# Patient Record
Sex: Male | Born: 1960 | Race: Black or African American | Hispanic: No | Marital: Married | Smoking: Former smoker
Health system: Southern US, Community
[De-identification: ages and names within clinical notes are randomized; demographics above are authoritative.]

## PROBLEM LIST (undated history)

## (undated) DIAGNOSIS — F99 Mental disorder, not otherwise specified: Secondary | ICD-10-CM

## (undated) HISTORY — PX: OTHER SURGICAL HISTORY: SHX169

---

## 2000-04-29 HISTORY — PX: OTHER SURGICAL HISTORY: SHX169

## 2000-11-06 ENCOUNTER — Encounter (HOSPITAL_COMMUNITY): Admission: RE | Admit: 2000-11-06 | Discharge: 2000-12-06 | Payer: Self-pay | Admitting: Preventative Medicine

## 2000-11-25 ENCOUNTER — Encounter: Payer: Self-pay | Admitting: Preventative Medicine

## 2000-11-25 ENCOUNTER — Ambulatory Visit (HOSPITAL_COMMUNITY): Admission: RE | Admit: 2000-11-25 | Discharge: 2000-11-25 | Payer: Self-pay | Admitting: Preventative Medicine

## 2000-12-21 ENCOUNTER — Emergency Department (HOSPITAL_COMMUNITY): Admission: EM | Admit: 2000-12-21 | Discharge: 2000-12-21 | Payer: Self-pay | Admitting: Emergency Medicine

## 2000-12-21 ENCOUNTER — Encounter: Payer: Self-pay | Admitting: Emergency Medicine

## 2001-02-25 ENCOUNTER — Encounter: Payer: Self-pay | Admitting: Orthopedic Surgery

## 2001-02-25 ENCOUNTER — Ambulatory Visit (HOSPITAL_COMMUNITY): Admission: RE | Admit: 2001-02-25 | Discharge: 2001-02-25 | Payer: Self-pay | Admitting: Orthopedic Surgery

## 2001-02-26 ENCOUNTER — Observation Stay (HOSPITAL_COMMUNITY): Admission: RE | Admit: 2001-02-26 | Discharge: 2001-02-27 | Payer: Self-pay | Admitting: Orthopedic Surgery

## 2001-12-20 ENCOUNTER — Emergency Department (HOSPITAL_COMMUNITY): Admission: EM | Admit: 2001-12-20 | Discharge: 2001-12-20 | Payer: Self-pay | Admitting: *Deleted

## 2001-12-20 ENCOUNTER — Encounter: Payer: Self-pay | Admitting: *Deleted

## 2002-02-10 ENCOUNTER — Ambulatory Visit (HOSPITAL_COMMUNITY): Admission: RE | Admit: 2002-02-10 | Discharge: 2002-02-10 | Payer: Self-pay | Admitting: Family Medicine

## 2002-02-10 ENCOUNTER — Encounter: Payer: Self-pay | Admitting: Family Medicine

## 2003-10-13 ENCOUNTER — Ambulatory Visit (HOSPITAL_COMMUNITY): Admission: RE | Admit: 2003-10-13 | Discharge: 2003-10-13 | Payer: Self-pay | Admitting: Orthopedic Surgery

## 2004-03-29 ENCOUNTER — Emergency Department (HOSPITAL_COMMUNITY): Admission: EM | Admit: 2004-03-29 | Discharge: 2004-03-29 | Payer: Self-pay | Admitting: Emergency Medicine

## 2004-05-15 ENCOUNTER — Ambulatory Visit (HOSPITAL_BASED_OUTPATIENT_CLINIC_OR_DEPARTMENT_OTHER): Admission: RE | Admit: 2004-05-15 | Discharge: 2004-05-15 | Payer: Self-pay | Admitting: Orthopedic Surgery

## 2008-08-03 ENCOUNTER — Emergency Department (HOSPITAL_COMMUNITY): Admission: EM | Admit: 2008-08-03 | Discharge: 2008-08-04 | Payer: Self-pay | Admitting: Emergency Medicine

## 2008-08-04 ENCOUNTER — Inpatient Hospital Stay (HOSPITAL_COMMUNITY): Admission: AD | Admit: 2008-08-04 | Discharge: 2008-08-09 | Payer: Self-pay | Admitting: Psychiatry

## 2008-08-04 ENCOUNTER — Ambulatory Visit: Payer: Self-pay | Admitting: Psychiatry

## 2010-05-20 ENCOUNTER — Encounter: Payer: Self-pay | Admitting: Family Medicine

## 2010-08-08 LAB — URINALYSIS, ROUTINE W REFLEX MICROSCOPIC
Hgb urine dipstick: NEGATIVE
Nitrite: NEGATIVE
Protein, ur: 30 mg/dL — AB
Urobilinogen, UA: 1 mg/dL (ref 0.0–1.0)

## 2010-08-08 LAB — CBC
MCHC: 34.5 g/dL (ref 30.0–36.0)
MCV: 85.5 fL (ref 78.0–100.0)
Platelets: 323 10*3/uL (ref 150–400)
RDW: 13.8 % (ref 11.5–15.5)

## 2010-08-08 LAB — DIFFERENTIAL
Basophils Absolute: 0 10*3/uL (ref 0.0–0.1)
Basophils Relative: 1 % (ref 0–1)
Eosinophils Absolute: 0 10*3/uL (ref 0.0–0.7)
Monocytes Relative: 11 % (ref 3–12)
Neutrophils Relative %: 63 % (ref 43–77)

## 2010-08-08 LAB — BASIC METABOLIC PANEL
BUN: 30 mg/dL — ABNORMAL HIGH (ref 6–23)
CO2: 26 mEq/L (ref 19–32)
Chloride: 92 mEq/L — ABNORMAL LOW (ref 96–112)
Creatinine, Ser: 1.31 mg/dL (ref 0.4–1.5)
Glucose, Bld: 116 mg/dL — ABNORMAL HIGH (ref 70–99)

## 2010-08-08 LAB — RAPID URINE DRUG SCREEN, HOSP PERFORMED
Amphetamines: NOT DETECTED
Opiates: NOT DETECTED
Tetrahydrocannabinol: NOT DETECTED

## 2010-08-08 LAB — URINE MICROSCOPIC-ADD ON

## 2010-09-11 NOTE — H&P (Signed)
NAME:  Marc Summers, Marc Summers NO.:  [id number]   MEDICAL RECORD NO.:  [id number]          PATIENT TYPE:  IPS   LOCATION:  0402                          FACILITY:  [facility]   PHYSICIAN:  Jasmine Pang, M.D. DATE OF BIRTH:  05-27-1960   DATE OF ADMISSION:  08/04/2008  DATE OF DISCHARGE:                       PSYCHIATRIC ADMISSION ASSESSMENT   A 50 year old male involuntarily petitioned on August 04, 2008.   HISTORY OF PRESENT ILLNESS:  The patient is here on petition.  He was  petitioned per his twin brother.  Petition papers state the patient has  not been eating, not communicating and isolating.  The patient does  state that he has not been talking to his brother.  Tells him that if he  wanted to talk to him, he will.  He does state that he has lost some  weight recently because he has no money yet.  He talks about going to  the bank and wanting to withdraw a fair amount of money so that he not  does have to keep walking back and forth to the bank.  He denies any  depression, suicidality.  Denies any hallucinations.  Denies any  substance use and has not had any past or current psychiatric treatment.   PAST PSYCHIATRIC HISTORY:  First admission to the [facility].  No other petitions.  No other psychiatric admissions.   SOCIAL HISTORY:  The patient lives alone.  He has 2 children ages 30 and  71 that he sees occasionally.  He is unemployed.  Denies any  disability.   FAMILY HISTORY:  None.   ALCOHOL/DRUG HISTORY:  He is a nonsmoker.  Denies any alcohol or drug  use.   PRIMARY CARE [place]:  Dr. Gerda Diss.   MEDICAL PROBLEMS:  He denies any acute or chronic health issues.   MEDICATIONS:  None prior to arrival.   DRUG ALLERGIES:  NO KNOWN ALLERGIES.   PHYSICAL EXAMINATION:  GENERAL:  This is a thin male.  Physical exam in  the emergency department states that the patient was uncooperative.  He  is today calm and cooperative, although somewhat  irritable.  VITAL SIGNS:  Temperature 98.4, 82 heart rate, 18 respirations, blood  pressure is 100/69.  He is 6 feet 1 inches tall, 159 pounds.   LABORATORY DATA:  CBC within normal limits.  Alcohol level less than 5.  Glucose of 116.  Urine drug screen is negative.  Urinalysis shows 15  ketones.   MENTAL STATUS EXAM:  He is again fully alert, dressed in scrubs,  cooperative, good eye contact.  Again somewhat irritable, somewhat  guarded.  Speech is clear.  He appears flat.  Thought processes are  coherent.  There is no evidence of any delusional statements.  He denies  any suicide or homicidal thoughts.  Cognitive function intact.  His  memory appears intact.  Judgment and insight is fair.   AXIS I:  Mood disorder.  AXIS II:  Deferred.  AXIS III:  No known medical conditions.  AXIS IV:  Deferred at this time.  AXIS V:  Current is 35.  PLAN:  Our plan is to monitor his food and fluid intake.  We will have a  family session with brother as soon as possible.  We offered medications  for sleep.  The patient declines at this time.  States he is just  feeling fine.  Staff is helpful.  Encouraged group activities.  Tentative length of stay at this time is 3-4 days.      Landry Corporal, N.P.      Jasmine Pang, M.D.  Electronically Signed    JO/MEDQ  D:  08/05/2008  T:  08/05/2008  Job:  [id number]
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
## 2010-09-14 NOTE — Op Note (Signed)
NAME:  Marc Summers, Marc Summers NO.:  [id number]   MEDICAL RECORD NO.:  [id number]          PATIENT TYPE:  AMB   LOCATION:  DSC                          FACILITY:  [facility]   PHYSICIAN:  Leonides Grills, M.D.     DATE OF BIRTH:  [birth year]   DATE OF PROCEDURE:  05/15/2004  DATE OF DISCHARGE:                                 OPERATIVE REPORT   PREOPERATIVE DIAGNOSES:  1.  Right varus ankle.  2.  Right hindfoot malposition.  3.  Loose bodies, right ankle.  4.  Right tibial spurs.  5.  Right peroneal tendon subluxation.  6.  Right peroneus brevis tendon tear.  7.  Right medial talar dome osteochondral lesion.  8.  Right deltoid ligament contracture.   POSTOPERATIVE DIAGNOSES:  1.  Right varus ankle.  2.  Right hindfoot malposition.  3.  Loose bodies, right ankle.  4.  Right tibial spurs.  5.  Right peroneal tendon subluxation.  6.  Right peroneus brevis tendon tear.  7.  Right medial talar dome osteochondral lesion.  8.  Right deltoid ligament contracture.   OPERATION:  1.  Right distal tibiofibular osteotomy, i.e. Weber-type.  2.  Open reduction, internal fixation, right distal tibiofibular osteotomy.  3.  Right lateralizing calcaneal osteotomy.  4.  Stress x-rays, right ankle.  5.  Repair right subluxing peroneal tendons.  6.  Excision right peroneus brevis tendon tear.  7.  Peroneus brevis to peroneus longus tendon transfer.  8.  Right ankle arthroscopy with extensive debridement.  9.  Debridement and drilling medial talar done osteochondral lesion, ankle      impingement.  10. Excision tibial spurs.  11. Removal of loose bodies, ankle.  12. Release deep deltoid ligament.   ANESTHESIA:  General endotracheal tube with popliteal block.   SURGEON:  Leonides Grills, M.D.   ASSISTANT:  Lianne Cure, P.A.-C.   ESTIMATED BLOOD LOSS:  Minimal.   TOURNIQUET TIME:  2 hours.   COMPLICATIONS:  None.   DISPOSITION:  Stable to PAR.   INDICATION:  This is a  50 year old male, who has had longstanding ankle pain  that was interfering with his life to the point he could not do what he  wants to do.  He is found to have a varus tilt to his ankle with a varus  alignment to his plafond and hindfoot varus with posterolateral ankle pain  and a loose body that was present on the anterior aspect of his ankle with  tibial spurs as well and a medial talar dome osteochondral lesion from the  abnormal eccentric wear on the talar dome.  He was consented for the above  procedure.  All risks which include infection, neurovascular injury,  nonunion, malunion, hardware rotation, hardware failure, persistent pain,  worsening pain, stiffness, arthritis, and possible ankle fusion, prolonged  recovery were all explained.  Questions were encouraged and answered.   DESCRIPTION OF OPERATION:  The patient was brought to the operating room and  placed in supine position.  After adequate general endotracheal tube  anesthesia was administered as well as Ancef 1 g IV piggyback,  a bump was  then placed under the right ipsilateral hip using a bean-bag.  All bony  prominences are well-padded, and the right lower extremity was then prepped  and draped in a sterile manner over a proximally-placed thigh tourniquet.  The limb was gravity-exsanguinated, and tourniquet was elevated to 290 mmHg.  A curvilinear incision was then made midline over the lateral malleolus,  extending beyond this about 2-3 cm.  Dissection was carried down through  skin, hemostasis obtained.  We progressive anterior to this and developed a  soft tissue plane just in front of not only the fibula but also the tibia.  Soft tissues were elevated over the tibia.  X-rays were then evaluated for  the soft tissue dissection which was directly over where the osteotomy would  be made.  No soft tissue was violated anywhere beyond this point medially  and laterally.  Bennetts were then placed.  And under C-arm  guidance, prior  to [facility] placement, two 2.0 mm K-wires were placed for the osteotomy  closing wedge cut, as preoperatively planned.  Once this was done, we then  used a sagittal saw with Bennetts in place and took out a wedge of bone,  cutting not only the fibula but the tibia on the inner aspects of both K-  wires, taking care to protect the soft tissues both anteriorly and  posteriorly.  Once this was done, the wedge was removed.  We then applied a  6-hole one-third tubular plate.  We placed initially a 3.5 fully-threaded  cortical set screw through the plate and then placed a push-pull screw  proximal to this.  We then placed a Verbrugge clamp and reduced and closed  the osteotomy down, keeping the medial cortex of the tibia intact and using  this as a hinge.  This had an excellent apposition of the osteotomy and  maintenance of the correction.  We then placed another 3.5 mm fully-threaded  cortical screw in a compressed position.  Because the fact that there was a  tremendous amount of strain on the hardware and there was bending of the  hardware, we then placed two 4.5 mm fully-threaded cortical four cortices-  type syndesmotic screws, and this had excellent maintenance of the  correction and as well as compression across the osteotomy site.  We then  replaced the 3.5 mm fully-threaded cortical screws with 4.5 mm fully-  threaded cortical screws.  This had excellent purchase and maintenance in  the correction and with  solid fixation.  We then obtained stress x-rays in  the AP and lateral planes, not only showed that the correction was  excellent, fixation was in proper position, but also that there was no gross  motion across the arthrodesis site, and the tibia did not open up medially.  We then copiously irrigated this area with normal saline.  We then dissected  posterior to this with a separate approach to the peroneal tendons.  We then made a longitudinal incision in the  retinacular sheath 2 mm just posterior  to the edge of the lateral malleolus.  We purposely put the hardware  anterior on the fibula to allow [place] room for repair of the peroneal  retinaculum for the subluxing peroneal tendons.  There was a tremendous  amount of synovitis and scar tissue within the peroneal tendons.  The  peroneus  brevis tendon was completely shredded for about 3-4 cm in length.  The longest was intact.  We excised the peroneus brevis tendon.  We then  transferred the peroneus brevis to peroneus longus tendons proximally using  #2 FiberWire.  We did not perform a tenodesis of the distal aspect of the  distal aspect of the peroneus brevis tendon due to the fact that there was  no excursion of this tendon distally, and it was scarred throughout.  This  was released and debrided as distal as possible.  We then copiously  irrigated with normal saline.  There were no palpable spurs in this area.  We then created a bed in the posterolateral corner of the lateral malleolus,  used a curved quarter-inch osteotome and a rongeur, and then multiple drills  were placed for suture repair.  We then placed #2 FiberWire through the  retinaculum and repaired it back down into this groove, respectively.  This  had excellent and outstanding solid repair.  The remaining portion of the  retinaculum was repaired with 2-0 FiberWire.  The area was copiously  irrigated with normal saline.  We then made a longitudinal incision over the  lateral wall of the calcaneus.  The dissection was carried down to bone.  Soft tissue was elevated both superiorly and inferiorly.  Osteotomy was then  made in the calcaneal tuber, perpendicular to the lateral wall of the  calcaneus.  We then freed up the soft tissues with a large osteotome and a  lamina spreader and then translated the calcaneus approximately 5-7 mm  laterally and provisionally fixed this with a 2-0 K-wire.  We then made a  longitudinal incision over  the midline aspect of the heel posteriorly at the  __________  skin border.  Two 55 mm long 16 mm threaded 6.5 mm cancellous  Synthes screws were then placed using 4.5, 3.2 mm drill holes, respectively.  This had excellent maintenance of the correction and purchase as well.  We  then obtained x-rays in the lateral and axial views of the hindfoot and  showed that the fixation was in proper position with an excellent correction  as well.  We then copiously irrigated the wounds with normal saline.  Tourniquet was deflated at 2 hours.  Hemostasis was obtained.  Then we  repaired the subcu with 3-0 Vicryl.  Skin was closed with 4-0 nylon.  We  then placed a spinal needle in the anteromedial aspect of the ankle just  medial to the anterior tibialis tendon.  There was a tremendous amount of  scar tissue in this area.  We then made a nick and spread technique to  create the anteromedial portal.  There was a large amount of synovitis and scar tissue over the entire anterior aspect of the ankle as well as the  loose body that was blocking the entrance as well.  We then, just lateral to  the peroneus tertius tendon, made a nick and spread technique in the  anterolateral portal.  We then placed the bevel radiofrequency wand into the  ankle and started to debride the synovitis, doing an extensive debridement  of the entire ankle.  This took quite some time.  Once this was completely  debrided of synovitis, we were able to visualize the loose bodies.  We then  required more extensile approach to this and did an actual ankle arthrotomy  to remove the loose bodies, for they were quite large.  Once the loose  bodies were removed with the synovectomy rongeur with the scope as an aid  through an ankle arthrotomy, we then were able to visualize the entire  ankle.  There was a large osteochondral lesion involving about 25% of the  talar dome surface medially.  We then used a microfracture technique to  place  multiple drill holes in the osteochondral lesion once it was  adequately debrided.  We then performed a deep deltoid ligament release  using a freer in the medial gutter of the ankle.  This had an excellent  release.  We then performed stress x-rays at the end and showed that the  ankle was adequately reduced.  There was no talar tilt.  I was very happy  with the repair.  Multiple pictures were obtained throughout the arthroscopy  as well.  We then debrided the talar spurs using a bur as well as a  synovectomy rongeur over the entire anterior aspect of the tibia.  This had  an excellent repair.  I was very satisfied with the overall repair.  All the  wounds were copiously irrigated with normal saline.  Skin was closed with 4-  0 nylon.  This entire portion of the procedure was done without tourniquet.  Sterile dressing was applied.  Jones dressing was applied.  The patient was  stable throughout.      Paul   PB/MEDQ  D:  05/15/2004  T:  05/15/2004  Job:  [id number]

## 2010-09-14 NOTE — Op Note (Signed)
[facility]  Patient:    XAIVER, ROSKELLEY I Visit Number: [id number] MRN: [id number]          Service Type: SUR Location: 4W 0450 01 Attending Physician:  Verlee Rossetti. Dictated by:   Almedia Balls Ranell Patrick, M.D. Proc. Date: 02/26/01 Admit Date:  02/26/2001 Discharge Date: 02/27/2001   CC:         Sherri Rad, M.D.   Operative Report  PREOPERATIVE DIAGNOSIS:  Right ankle anterior impingement with talar neck osteophyte, distal tibial osteophyte, and distal tibial chondral defect.  POSTOPERATIVE DIAGNOSIS:  Right ankle anterior impingement with talar neck osteophyte, distal tibial osteophyte, distal tibial chondral defect, and talar chondral defect.  ATTENDING SURGEON:  Almedia Balls. Ranell Patrick, M.D.  Mammie LorenzoMarland Kitchen  Sherri Rad, M.D.  ANESTHESIA:  Laryngeal mask.  ESTIMATED BLOOD LOSS:  Minimal.  TOURNIQUET TIME:  53 minutes.  FLUID REPLACEMENT:  1200 cc of crystalloid.  INSTRUMENT COUNT:  Correct.  COMPLICATIONS:  None.  ANTIBIOTICS:  No antibiotics were given.  INDICATIONS:  The patient is a 50 year old gentleman who presents complaining of chronic right ankle pain.  He is very active in basketball and states that he has difficulty dorsiflexing his foot.  Because of pain with impact activities located in the anterolateral aspect of the joint in addition he has had a couple of instability episodes where he has rolled his ankle.  The latest of these was back in June of 2002.  Pain is worse problem and he appears to have instability secondary to pain, which is preventing him from doing activities of daily living, as well as sports.  X-rays demonstrate large osteophytes on the neck of the talus, as well as the distal tibia, causing impingement.  In addition, there was noted what appears to be an osteochondral lesion on the distal aspect of his tibia.  After discussing with the patient the options of management, including continued physical  therapy, activity modifications versus arthroscopic evaluation of his osteochondral lesion, as well as open debridement of his bone spurs, he has elected to proceed with surgery.  DESCRIPTION OF PROCEDURE:  After an adequate level of laryngeal mask anesthesia was achieved and 1 g of Ancef was given preoperatively, the patient was positioned supine on the operating table and examination under anesthesia was performed to both ankles.  This demonstrated competency of the calcaneal, tibial, and anterior talar fibular ligaments on the right side.  His examination in the office had been limited secondary to pain.  In addition, significant heel varus was noted of his hind foot.  As well, dorsiflexion was limited to 0 where he could dorsiflex 20 degrees on his other ankle and he could plantar flex down to 60 degrees.  After completion of the EUA, the right lower extremity was prepped and draped in a sterile fashion.  A nonsterile tourniquet had been placed in the right proximal thigh.  Standard arthroscopic portals were created in the anteromedial and anterolateral gutters.  These were done using infiltration of 0.5% Marcaine with epinephrine followed by incision with an 11 blade scalpel through the skin, spreading using a hemostat down to the capsule and insertion of the scope cannula in the joint using a blunt obturator.  Diagnostic arthroscopy revealed significant chondral damage to the distal tibia, which was full thickness.  The loose flaps of cartilage were debrided using a full radius resector.  In addition, scar tissue was removed from medial and latter gutters with some hypertrophic synovium removed from the anterior and lateral  aspect of the joint.  Anterior tibial osteophytes were noted.  As well, there was noted to be a 1.5 x 2 cm area of eburnated bone on the talar dome in the anteromedial location.  This appeared to correspond with the foot dorsiflexed to impingement by an anterior  medial osteophyte.  The joint was thoroughly irrigated and arthroscopic debridement was concluded at this time.  The arthroscopic portals were extended distally for approximately 1.5 cm to allow open exposure of the anterior aspect of the tibiotalar joint.  An osteotome was then used to remove the overhanging spur back to the normal cartilage on the talar neck.  Again, the osteotome was used to remove the talar spur.  Direct visualization was achieved all the way across the anterior aspect of the joint.  The joint was thoroughly irrigated. At this time, the wound was thoroughly irrigated and then the wounds were closed using 2-0 Vicryl followed by a 4-0 nylon mattress suture.  A sterile dressing was applied followed by a short leg splint.  The patient was awaken and taken to the PACU in stable condition. Dictated by:   Almedia Balls Ranell Patrick, M.D. Attending Physician:  Malon Kindle R. DD:  02/26/01 TD:  03/01/01 Job: [id number]

## 2011-02-19 ENCOUNTER — Emergency Department (HOSPITAL_COMMUNITY)
Admission: EM | Admit: 2011-02-19 | Discharge: 2011-02-19 | Disposition: A | Discharge status: Other Acute Inpatient Hospital | Payer: Self-pay | Attending: Emergency Medicine | Admitting: Emergency Medicine

## 2011-02-19 ENCOUNTER — Emergency Department (HOSPITAL_COMMUNITY): Payer: Self-pay

## 2011-02-19 ENCOUNTER — Encounter: Payer: Self-pay | Admitting: *Deleted

## 2011-02-19 DIAGNOSIS — E876 Hypokalemia: Secondary | ICD-10-CM | POA: Insufficient documentation

## 2011-02-19 DIAGNOSIS — R45850 Homicidal ideations: Secondary | ICD-10-CM | POA: Insufficient documentation

## 2011-02-19 LAB — CBC
MCH: 28.4 pg (ref 26.0–34.0)
MCV: 83.7 fL (ref 78.0–100.0)
Platelets: 274 10*3/uL (ref 150–400)
RDW: 13.3 % (ref 11.5–15.5)
WBC: 7.5 10*3/uL (ref 4.0–10.5)

## 2011-02-19 LAB — DIFFERENTIAL
Basophils Absolute: 0 10*3/uL (ref 0.0–0.1)
Eosinophils Absolute: 0.2 10*3/uL (ref 0.0–0.7)
Eosinophils Relative: 3 % (ref 0–5)
Lymphocytes Relative: 41 % (ref 12–46)

## 2011-02-19 LAB — BASIC METABOLIC PANEL
Calcium: 9.4 mg/dL (ref 8.4–10.5)
GFR calc Af Amer: >90 mL/min (ref >90)
GFR calc non Af Amer: >90 mL/min (ref >90)
Sodium: 140 mEq/L (ref 135–145)

## 2011-02-19 LAB — URINALYSIS, ROUTINE W REFLEX MICROSCOPIC
Ketones, ur: NEGATIVE mg/dL
Leukocytes, UA: NEGATIVE
Nitrite: NEGATIVE
Protein, ur: NEGATIVE mg/dL

## 2011-02-19 LAB — RAPID URINE DRUG SCREEN, HOSP PERFORMED: Amphetamines: NOT DETECTED

## 2011-02-19 LAB — ETHANOL: Alcohol, Ethyl (B): <11 mg/dL (ref 0–11)

## 2011-02-19 MED ORDER — POTASSIUM CHLORIDE CRYS ER 20 MEQ PO TBCR
40.0000 meq | EXTENDED_RELEASE_TABLET | Freq: Once | ORAL | Status: DC
  Filled 2011-02-19: qty 2

## 2011-02-19 MED ORDER — ZIPRASIDONE MESYLATE 20 MG IM SOLR
20.0000 mg | Freq: Once | INTRAMUSCULAR | Status: AC
  Administered 2011-02-19: 20 mg via INTRAMUSCULAR

## 2011-02-19 MED ORDER — ZIPRASIDONE MESYLATE 20 MG IM SOLR
INTRAMUSCULAR | Status: AC
  Administered 2011-02-19: 20 mg via INTRAMUSCULAR
  Filled 2011-02-19: qty 20
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
## 2011-02-19 NOTE — ED Notes (Signed)
Ate 100% of lunch

## 2011-02-19 NOTE — ED Notes (Signed)
Pt encouraged to provide urine specimen; pt reports will provide as soon as possible; cooperative, pleasant; in no distress; offered meal tray, drink.
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
## 2011-02-19 NOTE — ED Notes (Signed)
Pt is sitting in chair watching TV. [employer] still; at bedside. Pt still in handcuffs. Vital signs WNL. NAD noted at this time.

## 2011-02-22 ENCOUNTER — Encounter (HOSPITAL_COMMUNITY): Payer: Self-pay | Admitting: *Deleted

## 2012-04-07 ENCOUNTER — Encounter (HOSPITAL_COMMUNITY): Payer: Self-pay | Admitting: *Deleted

## 2012-04-07 ENCOUNTER — Emergency Department (HOSPITAL_COMMUNITY): Payer: Self-pay

## 2012-04-07 ENCOUNTER — Emergency Department (HOSPITAL_COMMUNITY)
Admission: EM | Admit: 2012-04-07 | Discharge: 2012-04-10 | Disposition: A | Discharge status: Other Acute Inpatient Hospital | Payer: Self-pay | Attending: Emergency Medicine | Admitting: Emergency Medicine

## 2012-04-07 DIAGNOSIS — Z87891 Personal history of nicotine dependence: Secondary | ICD-10-CM | POA: Insufficient documentation

## 2012-04-07 DIAGNOSIS — F29 Unspecified psychosis not due to a substance or known physiological condition: Secondary | ICD-10-CM | POA: Insufficient documentation

## 2012-04-07 LAB — URINALYSIS, ROUTINE W REFLEX MICROSCOPIC
Leukocytes, UA: NEGATIVE
Nitrite: NEGATIVE
Specific Gravity, Urine: >1.03 — ABNORMAL HIGH (ref 1.005–1.030)
Urobilinogen, UA: 2 mg/dL — ABNORMAL HIGH (ref 0.0–1.0)
pH: 5.5 (ref 5.0–8.0)

## 2012-04-07 LAB — COMPREHENSIVE METABOLIC PANEL
ALT: 22 U/L (ref 0–53)
Alkaline Phosphatase: 52 U/L (ref 39–117)
CO2: 24 mEq/L (ref 19–32)
Calcium: 10.2 mg/dL (ref 8.4–10.5)
Chloride: 87 mEq/L — ABNORMAL LOW (ref 96–112)
GFR calc Af Amer: >90 mL/min (ref >90)
GFR calc non Af Amer: >90 mL/min (ref >90)
Glucose, Bld: 111 mg/dL — ABNORMAL HIGH (ref 70–99)
Sodium: 134 mEq/L — ABNORMAL LOW (ref 135–145)
Total Bilirubin: 1.3 mg/dL — ABNORMAL HIGH (ref 0.3–1.2)

## 2012-04-07 LAB — SALICYLATE LEVEL: Salicylate Lvl: <2 mg/dL — ABNORMAL LOW (ref 2.8–20.0)

## 2012-04-07 LAB — CBC WITH DIFFERENTIAL/PLATELET
Eosinophils Relative: 0 % (ref 0–5)
HCT: 46.4 % (ref 39.0–52.0)
Lymphocytes Relative: 22 % (ref 12–46)
Lymphs Abs: 2.2 10*3/uL (ref 0.7–4.0)
MCV: 82.3 fL (ref 78.0–100.0)
Neutro Abs: 6.9 10*3/uL (ref 1.7–7.7)
Platelets: 291 10*3/uL (ref 150–400)
RBC: 5.64 MIL/uL (ref 4.22–5.81)
WBC: 10.2 10*3/uL (ref 4.0–10.5)

## 2012-04-07 LAB — URINE MICROSCOPIC-ADD ON

## 2012-04-07 LAB — RAPID URINE DRUG SCREEN, HOSP PERFORMED
Barbiturates: NOT DETECTED
Cocaine: NOT DETECTED
Tetrahydrocannabinol: NOT DETECTED

## 2012-04-07 LAB — ACETAMINOPHEN LEVEL: Acetaminophen (Tylenol), Serum: <15 ug/mL (ref 10–30)

## 2012-04-07 MED ORDER — ONDANSETRON HCL 4 MG PO TABS: 4.0000 mg | ORAL_TABLET | Freq: Three times a day (TID) | ORAL | Status: DC | PRN

## 2012-04-07 MED ORDER — NICOTINE 21 MG/24HR TD PT24: 21.0000 mg | MEDICATED_PATCH | Freq: Every day | TRANSDERMAL | Status: DC

## 2012-04-07 MED ORDER — ZIPRASIDONE MESYLATE 20 MG IM SOLR: 20.0000 mg | Freq: Two times a day (BID) | INTRAMUSCULAR | Status: DC | PRN

## 2012-04-07 MED ORDER — IBUPROFEN 400 MG PO TABS: 600.0000 mg | ORAL_TABLET | Freq: Three times a day (TID) | ORAL | Status: DC | PRN

## 2012-04-07 NOTE — ED Provider Notes (Signed)
History   This chart was scribed for Glynn Octave, MD by Sofie Rower, ED Scribe. The patient was seen in room APA16A/APA16A and the patient's care was started at 4:08PM.     CSN: [id number]  Arrival date & time 04/07/12  1508   First MD Initiated Contact with Patient 04/07/12 1608      Chief Complaint  Patient presents with  . V70.1    (Consider location/radiation/quality/duration/timing/severity/associated sxs/prior treatment) The history is provided by the patient and the police. No language interpreter was used.    Marc Summers is a 51 y.o. male , who presents to the Emergency Department complaining of  gradual, progressively worsening, altered mental status, onset today (04/07/12). The pt was found within his home by his brother earlier this afternoon, whom became concerned after discovering that the pt had been wrapped within a sleeping bag, locked in a closet, trying to say warm. The pt informs he has been drinking plenty of water, however, he has been living in his home without running water or electricity over the past two years. Additionally, the pt informs he has not experienced a bowel movement within the last 53 days. Furthermore, the pt denies hematochezia, using any drugs, and experiencing hallucinations at present.     History reviewed. No pertinent past medical history.  History reviewed. No pertinent past surgical history.  No family history on file.  History  Substance Use Topics  . Smoking status: Not on file  . Smokeless tobacco: Not on file  . Alcohol Use: No      Review of Systems  10 Systems reviewed and all are negative for acute change except as noted in the HPI.    Allergies  Review of patient's allergies indicates no known allergies.  Home Medications  No current outpatient prescriptions on file.  BP 120/82  Pulse 112  Temp 97.5 F (36.4 C) (Oral)  Resp 20  Ht 5\' 11"  (1.803 m)  Wt 161 lb (73.029 kg)  BMI 22.45 kg/m2  SpO2  100%  Physical Exam  Nursing note and vitals reviewed. Constitutional: He is oriented to person, place, and time. He appears well-developed and well-nourished. No distress.       Pt presents malnourished and foul smelling.   HENT:  Head: Atraumatic.  Nose: Nose normal.  Mouth/Throat: Mucous membranes are dry.  Eyes: EOM are normal.  Neck: Normal range of motion.  Cardiovascular: Normal rate, regular rhythm and normal heart sounds.   Pulmonary/Chest: Effort normal and breath sounds normal. No respiratory distress.  Abdominal: Soft. Bowel sounds are normal. There is no tenderness.  Musculoskeletal: Normal range of motion.  Neurological: He is alert and oriented to person, place, and time. No cranial nerve deficit.       Cranial nerves intact. 5/5 strength throughout. No clonus detected.   Skin: Skin is warm and dry. He is not diaphoretic.  Psychiatric: He has a normal mood and affect. His behavior is normal.    ED Course  Procedures (including critical care time)  DIAGNOSTIC STUDIES: Oxygen Saturation is 100% on room air, normal by my interpretation.    COORDINATION OF CARE:   4:30 PM- Treatment plan discussed with patient. Pt agrees with treatment.  5:35PM- Phone consultation with ACT team. Tele-pysch evaluation reccommended.     Results for orders placed during the hospital encounter of 04/07/12  CBC WITH DIFFERENTIAL      Component Value Range   WBC 10.2  4.0 - 10.5 K/uL   RBC 5.64  4.22 - 5.81 MIL/uL   Hemoglobin 16.7  13.0 - 17.0 g/dL   HCT 16.1  09.6 - 04.5 %   MCV 82.3  78.0 - 100.0 fL   MCH 29.6  26.0 - 34.0 pg   MCHC 36.0  30.0 - 36.0 g/dL   RDW 40.9  81.1 - 91.4 %   Platelets 291  150 - 400 K/uL   Neutrophils Relative 67  43 - 77 %   Neutro Abs 6.9  1.7 - 7.7 K/uL   Lymphocytes Relative 22  12 - 46 %   Lymphs Abs 2.2  0.7 - 4.0 K/uL   Monocytes Relative 10  3 - 12 %   Monocytes Absolute 1.1 (*) 0.1 - 1.0 K/uL   Eosinophils Relative 0  0 - 5 %    Eosinophils Absolute 0.0  0.0 - 0.7 K/uL   Basophils Relative 0  0 - 1 %   Basophils Absolute 0.0  0.0 - 0.1 K/uL  COMPREHENSIVE METABOLIC PANEL      Component Value Range   Sodium 134 (*) 135 - 145 mEq/L   Potassium 4.1  3.5 - 5.1 mEq/L   Chloride 87 (*) 96 - 112 mEq/L   CO2 24  19 - 32 mEq/L   Glucose, Bld 111 (*) 70 - 99 mg/dL   BUN 26 (*) 6 - 23 mg/dL   Creatinine, Ser 7.82  0.50 - 1.35 mg/dL   Calcium 95.6  8.4 - 21.3 mg/dL   Total Protein 8.9 (*) 6.0 - 8.3 g/dL   Albumin 4.4  3.5 - 5.2 g/dL   AST 19  0 - 37 U/L   ALT 22  0 - 53 U/L   Alkaline Phosphatase 52  39 - 117 U/L   Total Bilirubin 1.3 (*) 0.3 - 1.2 mg/dL   GFR calc non Af Amer >90  >90 mL/min   GFR calc Af Amer >90  >90 mL/min  ETHANOL      Component Value Range   Alcohol, Ethyl (B) <11  0 - 11 mg/dL  URINE RAPID DRUG SCREEN (HOSP PERFORMED)      Component Value Range   Opiates NONE DETECTED  NONE DETECTED   Cocaine NONE DETECTED  NONE DETECTED   Benzodiazepines NONE DETECTED  NONE DETECTED   Amphetamines NONE DETECTED  NONE DETECTED   Tetrahydrocannabinol NONE DETECTED  NONE DETECTED   Barbiturates NONE DETECTED  NONE DETECTED  URINALYSIS, ROUTINE W REFLEX MICROSCOPIC      Component Value Range   Color, Urine AMBER (*) YELLOW   APPearance CLEAR  CLEAR   Specific Gravity, Urine >1.030 (*) 1.005 - 1.030   pH 5.5  5.0 - 8.0   Glucose, UA NEGATIVE  NEGATIVE mg/dL   Hgb urine dipstick SMALL (*) NEGATIVE   Bilirubin Urine MODERATE (*) NEGATIVE   Ketones, ur 40 (*) NEGATIVE mg/dL   Protein, ur TRACE (*) NEGATIVE mg/dL   Urobilinogen, UA 2.0 (*) 0.0 - 1.0 mg/dL   Nitrite NEGATIVE  NEGATIVE   Leukocytes, UA NEGATIVE  NEGATIVE  ACETAMINOPHEN LEVEL      Component Value Range   Acetaminophen (Tylenol), Serum <15.0  10 - 30 ug/mL  SALICYLATE LEVEL      Component Value Range   Salicylate Lvl <2.0 (*) 2.8 - 20.0 mg/dL  CK      Component Value Range   Total CK 52  7 - 232 U/L  URINE MICROSCOPIC-ADD ON       Component Value Range  Squamous Epithelial / LPF RARE  RARE   WBC, UA 0-2  <3 WBC/hpf   RBC / HPF 3-6  <3 RBC/hpf   Bacteria, UA FEW (*) RARE   Urine-Other MUCOUS PRESENT    TROPONIN I      Component Value Range   Troponin I <0.30  <0.30 ng/mL   Dg Abd Acute W/chest  04/07/2012  *RADIOLOGY REPORT*  Clinical Data: Medical clearance.  Abdominal pain.  ACUTE ABDOMEN SERIES (ABDOMEN 2 VIEW & CHEST 1 VIEW)  Comparison: None.  Findings: No infiltrate, congestive heart failure or pneumothorax. Heart size within normal limits.  No plain film evidence of bowel obstruction or free intraperitoneal air.  IMPRESSION: No acute abnormality.  Please see above   Original Report Authenticated By: Lacy Duverney, M.D.         Dg Abd Acute W/chest  04/07/2012  *RADIOLOGY REPORT*  Clinical Data: Medical clearance.  Abdominal pain.  ACUTE ABDOMEN SERIES (ABDOMEN 2 VIEW & CHEST 1 VIEW)  Comparison: None.  Findings: No infiltrate, congestive heart failure or pneumothorax. Heart size within normal limits.  No plain film evidence of bowel obstruction or free intraperitoneal air.  IMPRESSION: No acute abnormality.  Please see above   Original Report Authenticated By: Lacy Duverney, M.D.      1. Psychosis       MDM  Patient IVC by police after being found not taking care of himself.  Has been holed up in house for weeks without electricity or water.  Patient is paranoid, thinking his food is being poisoned. He is trying to "cleanse himself through God." Labs unremarkable. No obstruction on AAS. Patient refuses rectal exam to assess for impaction.  Psychiatry consult complete. Patient is delusional and out of touch with reality,hypervigilant. He is psychotic and benefits from psych admission. Geodon 20 mg IM every 12 hours as needed recommended.   Date: 04/07/2012  Rate: 88  Rhythm: normal sinus rhythm  QRS Axis: normal  Intervals: normal  ST/T Wave abnormalities: nonspecific ST/T changes  Conduction  Disutrbances:none  Narrative Interpretation:   Old EKG Reviewed: none available      I personally performed the services described in this documentation, which was scribed in my presence. The recorded information has been reviewed and is accurate.    Glynn Octave, MD 04/07/12 [phone]

## 2012-04-07 NOTE — ED Notes (Signed)
Per IVC papers, pt has been "distraught" over losing his mother and a disability case with his employment since 2009.  The papers state that pt has not been taking care of himself.  Pt reports to me that he has not eaten in 53 days.  States it's something that he and God are handling.  States, "I'm trying to cleanse".  When asked from what, states "there are a lot of steroids and other toxins that they are injecting in food".  Per IVC papers, pt was found at home with no running water, no heat, huddled in a closet.

## 2012-04-07 NOTE — ED Notes (Signed)
Telepsych completed.  

## 2012-04-08 NOTE — ED Provider Notes (Signed)
ACT [facility] has evaluated.  Pending placement at [facility] 400 hall bed.  [facility] states bed likely not going to be ready until tomorrow.    Laray Anger, DO 04/08/12 [phone]

## 2012-04-08 NOTE — Progress Notes (Signed)
Informed by Marc Summers, the ac on duty, that pt has been accepted by [facility] to [facility] pending a 400 hall bed.
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
## 2012-04-08 NOTE — ED Notes (Signed)
Family came to visit pt.  Pt remained calm.  nad noted

## 2012-04-08 NOTE — BHH Counselor (Signed)
Received a call from Glenetta Borg, supervisor at DSS. She was calling in regard to Mr Gumz. She explained that DSS was aware of Mr Belsito and that DSS is unable to do anything about the situation. Mr Ogawa has refused services, They contacted [facility] but could not get an out patient commitment, nor could they get a referral for intense out patient services, because the patient had to agree. Mr Boettner continues to refuse follow up and medications after discharge. Mr Schwandt may well benefit from an outpatient commitment to allow for services after discharge.

## 2012-04-08 NOTE — ED Notes (Signed)
MD at bedside. 

## 2012-04-08 NOTE — ED Notes (Signed)
[facility] called and reports that pt has been placed on the waiting list.

## 2012-04-09 ENCOUNTER — Encounter (HOSPITAL_COMMUNITY): Payer: Self-pay

## 2012-04-09 NOTE — ED Notes (Signed)
Pt ate 100% breakfast tray and requested peanut butter crackers and water.

## 2012-04-09 NOTE — ED Notes (Signed)
Patient is turn on side and appears to be resting.  Even rise and fall of chest.  No complaints expressed at this time and no needs verbalized.  Sitter continues to observe at bedside.

## 2012-04-09 NOTE — ED Notes (Signed)
Room assignment now available when this RN called to ask if patient had been assigned a room.  Report given at that time to [place], RN    Assigned to room 401 bed 02

## 2012-04-09 NOTE — Progress Notes (Signed)
04/09/12 11:56 AM Talked with pt who continues to be delusional. Continues to say that he had not eaten or had a BM in 53 days prior to coming to the hospital. He said that his family just did not understand that he was drinking water to clean his system out and that God was helping him. His plan was to loose weight and then start eating again. He does state that he is eating now and had a BM yesterday. Does not want to take any medications: He leaves that up to God.

## 2012-04-10 ENCOUNTER — Encounter (HOSPITAL_COMMUNITY): Payer: Self-pay

## 2012-04-10 ENCOUNTER — Inpatient Hospital Stay (HOSPITAL_COMMUNITY)
Admission: EM | Admit: 2012-04-10 | Discharge: 2012-04-16 | DRG: 885 | Disposition: A | Discharge status: Home / Self Care | Payer: Federal, State, Local not specified - Other | Source: Ambulatory Visit | Attending: Psychiatry | Admitting: Psychiatry

## 2012-04-10 DIAGNOSIS — F39 Unspecified mood [affective] disorder: Secondary | ICD-10-CM | POA: Diagnosis present

## 2012-04-10 DIAGNOSIS — F1210 Cannabis abuse, uncomplicated: Secondary | ICD-10-CM | POA: Diagnosis present

## 2012-04-10 DIAGNOSIS — F22 Delusional disorders: Principal | ICD-10-CM | POA: Diagnosis present

## 2012-04-10 HISTORY — DX: Mental disorder, not otherwise specified: F99

## 2012-04-10 MED ORDER — HALOPERIDOL 2 MG PO TABS
2.0000 mg | ORAL_TABLET | Freq: Two times a day (BID) | ORAL | Status: DC
  Filled 2012-04-10 (×16): qty 1

## 2012-04-10 MED ORDER — ADULT MULTIVITAMIN W/MINERALS CH
1.0000 | ORAL_TABLET | Freq: Every day | ORAL | Status: DC
  Filled 2012-04-10 (×9): qty 1

## 2012-04-10 MED ORDER — BENZTROPINE MESYLATE 0.5 MG PO TABS
0.5000 mg | ORAL_TABLET | Freq: Two times a day (BID) | ORAL | Status: DC
  Filled 2012-04-10 (×16): qty 1

## 2012-04-10 MED ORDER — TRAZODONE HCL 50 MG PO TABS: 50.0000 mg | ORAL_TABLET | Freq: Every evening | ORAL | Status: DC | PRN

## 2012-04-10 NOTE — Tx Team (Signed)
Initial Interdisciplinary Treatment Plan  PATIENT STRENGTHS: (choose at least two) Average or above average intelligence Communication skills Religious Affiliation  PATIENT STRESSORS: Financial difficulties Health problems   PROBLEM LIST: Problem List/Patient Goals Date to be addressed Date deferred Reason deferred Estimated date of resolution  psychosis 04/10/2012                                                      DISCHARGE CRITERIA:  Ability to meet basic life and health needs Improved stabilization in mood, thinking, and/or behavior Medical problems require only outpatient monitoring Verbal commitment to aftercare and medication compliance  PRELIMINARY DISCHARGE PLAN: Attend aftercare/continuing care group Attend PHP/IOP Return to previous living arrangement  PATIENT/FAMIILY INVOLVEMENT: This treatment plan has been presented to and reviewed with the patient, Marc Summers, and/or family member,.  The patient and family have been given the opportunity to ask questions and make suggestions.  JEHU-APPIAH, Mirko Tailor K 04/10/2012, 5:16 AM
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
## 2012-04-10 NOTE — Treatment Plan (Signed)
Interdisciplinary Treatment Plan Update (Adult)  Date: 04/10/2012  Time Reviewed: 11:35 AM   Progress in Treatment: Attending groups: No Participating in groups: No Taking medication as prescribed: Yes Tolerating medication: Yes   Family/Significant other contact made:  No Patient understands diagnosis:  No  Extremely paranoid with no insight Discussing patient identified problems/goals with staff:  Yes  See below Medical problems stabilized or resolved:  Yes Denies suicidal/homicidal ideation: Yes Issues/concerns per patient self-inventory:  Not filled out Other:  New problem(s) identified: N/A  Reason for Continuation of Hospitalization: Hallucinations Medication stabilization Other; describe Paranoia  Interventions implemented related to continuation of hospitalization: Anti psychotic medication trial  Encourage group attendance and participation  Additional comments:  Estimated length of stay: 4-5 days  Discharge Plan: unknown  New goal(s): N/A  Review of initial/current patient goals per problem list:   1.  Goal(s): Eliminate psychosis  Met:  No  Target date:12/18  As evidenced [id number] will appear less paranoid and will report no AH  2.  Goal (s): Identify comprehensive mental wellness plan  Met:  No  Target date:12/18  As evidenced by: Self report in discussion with social worker  3.  Goal(s): Identify dispositional plan  Met:  No  Target date:12/18  As evidenced [id number] report  4.  Goal(s):  Met:  No  Target date:  As evidenced by:  Attendees: Patient:     Family:     Physician:  Thedore Mins 04/10/2012 11:35 AM   Nursing:  Nestor Ramp  04/10/2012 11:35 AM   Clinical Social Worker:  Richelle Ito 04/10/2012 11:35 AM   Extender:  Verne Spurr PA 04/10/2012 11:35 AM   Other:     Other:     Other:     Other:      Scribe for Treatment Team:   Ida Rogue, 04/10/2012 11:35 AM
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
## 2012-04-10 NOTE — ED Notes (Signed)
Left with [facility] Deputy to transport to [facility]. Clothing sent with patient/deputy

## 2012-04-10 NOTE — Progress Notes (Signed)
Nutrition Brief Note  Patient identified on the Malnutrition Screening Tool (MST) Report  Body mass index is 21.30 kg/(m^2). Pt meets criteria for WNL based on current BMI.   Currently without diet order; pt was admitted this morning to [facility]. Labs and medications reviewed.   Chart reviewed, note pt's report of not eating for several day for "cleansing" and decreased ability for self care at home.  Wt hx currently unknown.  Significant wt change between 12/10 and 12/13.  Will need to re-weigh.   Per chart review, available wt's include 159 lbs in 07/2008, indicating the wt of 161 lbs may be closer to a usual wt for pt. Will need to assess pt's intake prior to initiating any supplements.  If wt change and pt's report of not eating for 53 days is correct, he is at-risk for refeeding syndrome.  Supplements will not be ordered at this time to modulate intake.  Will order an MVI for pt for supplemental K, Mg, and Phos.  Notified staff of nutrition concerns. If nutrition issues arise, please consult RD.   Loyce Dys, MS RD LDN Clinical Inpatient Dietitian Pager: [phone] Weekend/After hours pager: [phone]

## 2012-04-10 NOTE — Progress Notes (Signed)
[facility] LCSW Aftercare Discharge Planning Group Note  04/10/2012 11:45 AM  Participation Quality:  did not attend    Marc Summers 04/10/2012, 11:45 AM

## 2012-04-10 NOTE — Progress Notes (Signed)
Patient ID: Marc Summers, male   DOB: [birth year], 51 y.o.   MRN: [id number] D: Patient in dayroom on approach. Pt presented with depressed mood. Pt is still religiously preoccupied stating he "doesn't need medications that God will cure him". Pt is cooperative and stated he is willing to make the best out of the situation. Pt denies SI/HI/AVH and pain. Pt stated he had diarrha this afternoon but had subsided. Pt attended evening wrap up group and Interacted appropriately with peers. Pt stated he had a "great day".  No acute distressed noted at this time. Pt encouraged to come to staff with any question or concerns   A: Met with pt 1:1. Safety has been maintained with Q15 minutes observation. Supported and encouragement provided to attend groups.   R: Patient remains safe. He is complaint with  group programming. Safety has been maintained Q15 and continue current POC

## 2012-04-10 NOTE — Progress Notes (Signed)
[facility] Group Notes:  (Counselor/Nursing/MHT/Case Management/Adjunct)  04/10/2012 11:09 PM  Type of Therapy:  Psychoeducational Skills  Participation Level:  Minimal  Participation Quality:  Redirectable  Affect:  Blunted  Cognitive:  Lacking  Insight:  Lacking  Engagement in Group:  Resistant  Engagement in Therapy:  Resistant  Modes of Intervention:  Education  Summary of Progress/Problems:The patient described his day as being "great".  He would not elaborate any further or offer any details. He states that he couldn't state a goal at this time,ie. "its hard to set a goal".    Caidence Kaseman S 04/10/2012, 11:09 PM

## 2012-04-10 NOTE — ED Notes (Signed)
Awaiting arrival of Law Enforcement to transport patient to [facility] per IVC papers

## 2012-04-10 NOTE — Progress Notes (Signed)
Patient ID: Marc Summers, male   DOB: December 18, 1960, 51 y.o.   MRN: [id number] 04-10-12 nursing shift note: D: pt is paranoid and hyper religious. He is a fall risk and uses a wheelchair. When asked why he was here, he blamed his family for putting him in the hospital. A: when asked by rn  why is here , he stated he had been non compliant with his home medication and had been fasting (not eating) for 53 days "he said", leading him to an involuntary admission.  He is eating on the unit. He has been in this facility before.  R: he denied any si/hi/av.  will monitor and q 15 min cks continue.

## 2012-04-11 DIAGNOSIS — F39 Unspecified mood [affective] disorder: Secondary | ICD-10-CM

## 2012-04-11 DIAGNOSIS — F22 Delusional disorders: Principal | ICD-10-CM

## 2012-04-11 NOTE — Progress Notes (Signed)
Psychoeducational Group Note  Date:  04/11/2012 Time:  2000  Group Topic/Focus:  Wrap-Up Group:   The focus of this group is to help patients review their daily goal of treatment and discuss progress on daily workbooks.  Participation Level:  Active  Participation Quality:  Appropriate  Affect:  Appropriate  Cognitive:  Appropriate  Insight:  Limited  Engagement in Group:  Engaged  Additional Comments:  Patient attended and participated in group tonight. He reports having a great day. He observed and enjoyed intelligent people and got a chance to attend groups  Marc Summers 04/11/2012, 9:47 PM

## 2012-04-11 NOTE — Progress Notes (Signed)
Patient ID: Marc Summers, male   DOB: May 26, 1960, 51 y.o.   MRN: [id number] 04-11-12 nursing shift note: d: he has taken his medications today, had no complaints and came to groups. A: when participating in the group he had to be redirected by the rn to stay on the subject. He was easily redirectable and cooperative. R: rn will continue to support, praise and acknowledge the pt. rn will monitor and q 15 min cks continue.

## 2012-04-11 NOTE — Progress Notes (Signed)
Psychoeducational Group Note  Date:  04/11/2012 Time:0915am  Group Topic/Focus:  Identifying Needs:   The focus of this group is to help patients identify their personal needs that have been historically problematic and identify healthy behaviors to address their needs.  Participation Level:  Minimal  Participation Quality:  Resistant  Affect:  Appropriate  Cognitive:  Appropriate  Insight:  Poor  Engagement in Group:  Poor  Additional Comments:  Inventory group   Valente David 04/11/2012,10:03 AM
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
## 2012-04-11 NOTE — Progress Notes (Signed)
Patient ID: Marc Summers, male   DOB: Nov 11, 1960, 51 y.o.   MRN: [id number]   S:   Seen today. Stays in his room most of the time. Reports good sleep and good mood. Thinks he is doing well. Not sure why he is here.  MSE:   General Appearance: Bizarre and Disheveled  Eye Contact:: Fair  Speech: Clear and Coherent  Volume: Increased  Mood: Angry and Irritable  Affect: Congruent  Thought Process: Disorganized  Orientation: Full (Time, Place, and Person)  Thought Content: Delusions and Paranoid Ideation  Suicidal Thoughts: No  Homicidal Thoughts: No Memory: Immediate; Fair  Recent; Fair  Remote; Fair  Judgement: Poor  Insight: Lacking  Psychomotor Activity: Increased  Concentration: Poor  Recall: Fair  Akathisia: No  Handed: Right  AIMS (if indicated):  Assets: Communication Skills  Sleep: Number of Hours: 0     Assessment:  AXIS I: Mood Disorder NOS. Delusional disorder, NOS  AXIS II: Deferred  AXIS III:  Past Medical History   S/p right ankle ORIF    Diagnosis  Date   AXIS IV: economic problems, occupational problems and other psychosocial or environmental problems  AXIS V: 21-30 behavior considerably influenced by delusions or hallucinations OR serious impairment in judgment, communication OR inability to function in almost all areas  Treatment Plan/Recommendations:  Continue current meds

## 2012-04-12 NOTE — Progress Notes (Addendum)
Patient ID: Marc Summers, male   DOB: 1961-04-04, 51 y.o.   MRN: [id number] 04-12-12 @ 1600 D: pt is still very resistant to care. He is not taking his medications and is staying in his room a great deal of the time. A staff continue to encourage and support him. R: he still will not take any medications. He does deny any si/hi/av. rn will monitor and q 15 min cks continue.

## 2012-04-12 NOTE — Progress Notes (Signed)
Psychoeducational Group Note  Date:  04/12/2012 Time:  2000  Group Topic/Focus:  Wrap-Up Group:   The focus of this group is to help patients review their daily goal of treatment and discuss progress on daily workbooks.  Participation Level:  Active  Participation Quality:  Appropriate  Affect:  Appropriate  Cognitive:  Appropriate  Insight:  Engaged  Engagement in Group:  Engaged  Additional Comments:  Patient attended and participated in group tonight. He reports having a normal day. He went to groups, went to his meals, and socialized in the day room.  He reports that his support system was God who provides and protects  Scot Dock 04/12/2012, 9:11 PM
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
## 2012-04-12 NOTE — Progress Notes (Signed)
Pt reports having a great day. "You know it was a typical day here. It was a good day." His affect is blunted, mood remains suspicious. He reports poor sleep last night but refuses trazadone prn. Hygiene is poor. He continues to ambulate in his wheelchair. When asked about support in the community pt remains religiously preoccupied. Denies needing anyone else because "God will provide." Encouraged pt to consider trazadone prn. Also reviewed fall precautions. Assisted pt in resolving conflict with roommate over temperature in room. He states, "I do not need to be medicated. I'm just fine.' Denies SI/HI/AVH and remains safe. Lawrence Marseilles

## 2012-04-12 NOTE — Clinical Social Work Note (Signed)
[facility] Group Notes:  (Clinical Social Work)  04/12/2012   11:15-11:45AM  Summary of Progress/Problems:  The main focus of today's process group was to listen to a variety of genres of music and to identify that different types of music provoke different responses.  The patient then was able to identify personally what was soothing for them, as well as energizing.  Examples were given of how to use this knowledge in sleep habits, with depression, and with other symptoms.  The patient expressed that he believes that music does help him considerably, and he was attentive throughout, as well as suggested certain types of music to play.  Type of Therapy:  Music Therapy with processing done  Participation Level:  Active  Participation Quality:  Appropriate  Affect:  Appropriate  Cognitive:  Appropriate  Insight:  Engaged  Engagement in Therapy:  Engaged  Modes of Intervention:   Socialization, Support and Processing, Exploration, Education, Rapport Building   [employer], LCSW 04/12/2012, 12:25 PM

## 2012-04-12 NOTE — Progress Notes (Signed)
Patient ID: DASAN HARDMAN, male   DOB: 04/26/1961, 51 y.o.   MRN: [id number]   S:   Seen today. More active on the unit now and attending groups.  Reports good sleep and good mood.  Not sure why he is here. Still have delusions about suing other people.   MSE:   General Appearance: Bizarre and Disheveled  Eye Contact:: Fair  Speech: Clear and Coherent  Volume: Increased  Mood: Angry and Irritable  Affect: Congruent  Thought Process: Disorganized  Orientation: Full (Time, Place, and Person)  Thought Content: Delusions and Paranoid Ideation  Suicidal Thoughts: No  Homicidal Thoughts: No Memory: Immediate; Fair  Recent; Fair  Remote; Fair  Judgement: Poor  Insight: Lacking  Psychomotor Activity: Increased  Concentration: Poor  Recall: Fair  Akathisia: No  Handed: Right  AIMS (if indicated):  Assets: Communication Skills  Sleep: Number of Hours: 0     Assessment:  AXIS I: Mood Disorder NOS. Delusional disorder, NOS  AXIS II: Deferred  AXIS III:  Past Medical History   S/p right ankle ORIF    Diagnosis  Date   AXIS IV: economic problems, occupational problems and other psychosocial or environmental problems  AXIS V: 21-30 behavior considerably influenced by delusions or hallucinations OR serious impairment in judgment, communication OR inability to function in almost all areas  Treatment Plan/Recommendations:  Continue current meds

## 2012-04-12 NOTE — Progress Notes (Signed)
Psychoeducational Group Note  Date:  04/12/2012 Time:  0930am  Group Topic/Focus:  Making Healthy Choices:   The focus of this group is to help patients identify negative/unhealthy choices they were using prior to admission and identify positive/healthier coping strategies to replace them upon discharge.  Participation Level:  Did Not Attend  Participation Quality:    Affect:    Cognitive: Insight: Engagement in Group:  Additional Comments:  Inventory group   Valente David 04/12/2012,9:33 AM

## 2012-04-12 NOTE — Progress Notes (Signed)
Spoke with pt 1:1 who has been in the dayroom all evening. He has interacted appropriately with staff and peers. He remains religiously preoccupied. States today has been a great day and continues to be confused as to why he was admitted. Attended evening group. Supported, encouraged. Fall precautions reviewed. He denies SI/HI/AVH at present and remains safe. Lawrence Marseilles

## 2012-04-13 NOTE — Clinical Social Work Note (Signed)
  Type of Therapy: Process Group Therapy  Participation Level:  Active  Participation Quality:  Appropriate  Affect:  Appropriate  Cognitive:  Appropriate  Insight:  Limited  Engagement in Group:  Engaged  Engagement in Therapy:  Limited  Modes of Intervention:  Activity, Clarification, Education, Problem-solving and Support  Summary of Progress/Problems: Today's group addressed the issue of overcoming obstacles.  Patients were asked to identify their biggest obstacle post d/c that stands in the way of their on-going success, and then problem solve as to how to manage this.  Bradie was engaged in group.  He was in a philosophical mood.  Stated he is here for a reason even though he is not sure what that is yet.  Is clear he does not need to be here for psychiatric stabilization, "but its all good."  Talks vaguely about a plan to go to [place], then to [place], "but I have no obstacles."       Daryel Gerald B 04/13/2012   5:11 PM

## 2012-04-13 NOTE — Progress Notes (Signed)
Psychoeducational Group Note  Date:  04/13/2012 Time:  2000  Group Topic/Focus:  Wrap-Up Group:   The focus of this group is to help patients review their daily goal of treatment and discuss progress on daily workbooks.  Participation Level: Did Not Attend  Participation Quality:  Not Applicable  Affect:  Not Applicable  Cognitive:  Not Applicable  Insight:  Not Applicable  Engagement in Group: Not Applicable  Additional Comments:  Pt did not attend group.  Christ Kick 04/13/2012, 9:46 PM

## 2012-04-13 NOTE — Progress Notes (Signed)
[facility] LCSW Aftercare Discharge Planning Group Note  04/13/2012 10:19 AM  Participation Quality:  Attentive  Affect:  Appropriate  Cognitive:  Appropriate  Insight:  Limited  Engagement in Group:  Engaged  Modes of Intervention:  Discussion, Exploration and Socialization  Summary of Progress/Problems:Miki is engaging and eager to talk. States he is here IVC, and this is because he was staying with family, it was time to part ways, he went back to his place that has no heat or water, and they committed him. Admits to not eating for 60 days or so, but he was fasting as a way of purging his system and focusing on deliverance from using alcohol and drugs. Says people say he has psychosis, but he does not, and plans on taking no meds.  Is fine with returning to his home where there are no utilities because "i've been living like this for 3 years now.  It's not a problem." Daryel Gerald B 04/13/2012, 10:19 AM

## 2012-04-13 NOTE — Progress Notes (Signed)
Patient ID: Marc Summers, male   DOB: 08-01-1960, 51 y.o.   MRN: [id number] D: Pt. In bed but ad lib to BR, pt. Uses WC for support.  Pt. Reports feet swollen. Pt. Says "brother had paper work done, this the forth time"  Pt. 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 "had a loose stool on my self.  Pt. Reports he was staying with his family but they was eating stuff he didn't eat. "so I told them I'm going back home" Pt. Reports he was trying to get his body clean from all the drugs, prostitution and all kind of food." "I don't understand why that's a problem." "I was at my house, I don't have no electricity or water but I'm fine with that" "my neighbor brings me a jug of well water everyday" "I told my brother don't bring me a case just two bottles"  "when I was drugging as stuff they didn't have a problem with that, don't that seem funny." A: Writer introduced self to patient and provided emotional support by listening. Staff will monitor q85min for safety. Writer encouraged pt. To attend group. Noted non pitting edema in feet, more pronounced in right ankle. R: Writer elevate foot of the bed to help reduce swelling. Pt. Is safe on the unit. Pt. Did not attend group.

## 2012-04-13 NOTE — Progress Notes (Signed)
Pt continues to be isolative this evening. Pt been in bed resting. Noncompliant with group and recreation. Pt had his second foul smelling loose stool on himself today since admission. Pt report it's d/t him eating fruit. Pt cooperative and cleans himself up. Staff continues to monitor pt stool occurrences.

## 2012-04-13 NOTE — Progress Notes (Signed)
Pt denies SI/HI/AVH. Pt denies pain and s/s of distress. Pt noncompliant with meds. Pt reported that he do not understand why he is taking meds and that he was told that he would not be on any meds. Pt reported that he is not psychotic so why do he need meds. Pt reported that he is cleansing his body of drugs and prostitutes and that god is in control of healing. Pt reported that he is here because of his family and that he see no wrong in cleansing his body, Pt engaged on milieu. Cooperative to staff.  Medications offered to pt as ordered per MD. Verbal support given. Pt received education on meds. 15 minute checks performed for safety. Pt encouraged to attend groups.  Pt appears to have some confusion. Paranoid. Disorganized thoughts.

## 2012-04-13 NOTE — Treatment Plan (Signed)
Interdisciplinary Treatment Plan Update (Adult)  Date: 04/13/2012  Time Reviewed: 10:19 AM   Progress in Treatment: Attending groups: Yes Participating in groups: Yes Taking medication as prescribed: No refusing Tolerating medication: see above   Family/Significant other contact made:  No Patient understands diagnosis:  No  States he has no psychiatric problems Discussing patient identified problems/goals with staff:  Yes  See below Medical problems stabilized or resolved:  Yes Denies suicidal/homicidal ideation: Yes  In tx team Issues/concerns per patient self-inventory:  Not filled out Other:  New problem(s) identified: N/A  Reason for Continuation of Hospitalization: Other; describe Assess for at risk thoughts, behavior  Interventions implemented related to continuation of hospitalization: Encourage group attendance and participation,   Additional comments:  Estimated length of stay: 2-3 days  Discharge Plan: return home, follow up outpt  New goal(s): N/A  Review of initial/current patient goals per problem list:   1.  Goal(s): Eliminate psychosis  Met:  Yes  Target date:12/16  As evidenced [id number] presents with no evidence of psychosis  2.  Goal (s): Get out of here ASAP  Met:  No  Target date:12/19  As evidenced by:D/C order  3.  Goal(s):  Met:  No  Target date:  As evidenced by:  4.  Goal(s):  Met:  No  Target date:  As evidenced by:  Attendees: Patient:     Family:     Physician:  Thedore Mins 04/13/2012 10:19 AM   Nursing:    04/13/2012 10:19 AM   Clinical Social Worker:  Richelle Ito 04/13/2012 10:19 AM   Extender:   04/13/2012 10:19 AM   Other:     Other:     Other:     Other:      Scribe for Treatment Team:   Ida Rogue, 04/13/2012 10:19 AM

## 2012-04-13 NOTE — Progress Notes (Signed)
[facility] MD Progress Note  04/13/2012 2:58 PM Marc Summers  MRN:  [id number] Subjective:  Delusional Diagnosis:   Axis I: Mood Disorder NOS Axis II: Deferred Axis III:  Past Medical History  Diagnosis Date  . Mental disorder    Axis IV: economic problems, other psychosocial or environmental problems, problems related to social environment and problems with primary support group Axis V: 21-30 behavior considerably influenced by delusions or hallucinations OR serious impairment in judgment, communication OR inability to function in almost all areas  ADL's:  Intact  Sleep: Fair  Appetite:  Fair  Suicidal Ideation:  Denies  Homicidal Ideation:  Denies Psychiatric Specialty Exam: Review of Systems  Constitutional: Negative.   HENT: Negative.   Eyes: Negative.   Respiratory: Negative.   Cardiovascular: Negative.   Gastrointestinal: Negative.   Genitourinary: Negative.   Musculoskeletal: Negative.        Complaining of feet edema  Skin: Negative.   Neurological: Negative.   Endo/Heme/Allergies: Negative.   Psychiatric/Behavioral:       Delusional    Blood pressure 101/67, pulse 86, temperature 97.9 F (36.6 C), temperature source Oral, resp. rate 20, height 5\' 7"  (1.702 m), weight 61.689 kg (136 lb).Body mass index is 21.30 kg/(m^2).  General Appearance: Casual  Eye Contact::  Fair  Speech:  Normal Rate  Volume:  Increased  Mood:  Anxious  Affect:  Flat  Thought Process:  Disorganized and Loose  Orientation:  Full (Time, Place, and Person)  Thought Content:  Delusions  Suicidal Thoughts:  No  Homicidal Thoughts:  No  Memory:  Immediate;   Fair Recent;   Poor Remote;   Poor  Judgement:  Poor  Insight:  Lacking  Psychomotor Activity:  Increased  Concentration:  Fair  Recall:  Poor  Akathisia:  No  Handed:  Right  AIMS (if indicated):     Assets:  Social Support  Sleep:  Number of Hours: 3.75    Current Medications: Current Facility-Administered Medications   Medication Dose Route Frequency Provider Last Rate Last Dose  . benztropine (COGENTIN) tablet 0.5 mg  0.5 mg Oral BID Mojeed Akintayo      . haloperidol (HALDOL) tablet 2 mg  2 mg Oral BID Mojeed Akintayo      . multivitamin with minerals tablet 1 tablet  1 tablet Oral Daily Ashley Jacobs, RD      . traZODone (DESYREL) tablet 50 mg  50 mg Oral QHS PRN Mojeed Akintayo        Lab Results: No results found for this or any previous visit (from the past 48 hour(s)).  Physical Findings: AIMS:  , ,  ,  ,    CIWA:    COWS:     Treatment Plan Summary: Daily contact with patient to assess and evaluate symptoms and progress in treatment Medication management  Plan:  Review of chart, vital signs, medications, and notes. 1-Individual and group therapy continues 2-Medication management for delusions 3-Discharge plan to prevent reoccurrence 4-Supportive environment provided to optimize rehab 5-Wheelchair provided for "feet issues"  Medical Decision Making Problem Points:  Established problem, stable/improving (1) and Review of last therapy session (1) Data Points:  Review of medication regiment & side effects (2)  I certify that inpatient services furnished can reasonably be expected to improve the patient's condition.   Nanine Means, PMH-NP 04/13/2012, 2:58 PM

## 2012-04-14 NOTE — Progress Notes (Signed)
[facility] LCSW Aftercare Discharge Planning Group Note  04/14/2012 3:41 PM  Participation Quality:  Attentive  Affect:  Appropriate  Cognitive:  Appropriate  Insight:  Limited  Engagement in Group:  Engaged  Modes of Intervention:  Discussion, Exploration and Socialization  Summary of Progress/Problems:Marc Summers reports no new problems today.  He is still using a wheelchair because of edema.  He is convinced that it is due to something he is eating here.  Unconcerned.  Feels it will clear up on its own.  Ready to go whenever we are ready to release him.  Daryel Gerald B 04/14/2012, 3:41 PM

## 2012-04-14 NOTE — Progress Notes (Signed)
Seen and agreed. Berklee Battey, MD 

## 2012-04-14 NOTE — Progress Notes (Signed)
Patient ID: JAMISEN DUERSON, male   DOB: 14-Mar-1961, 51 y.o.   MRN: [id number]  D: Pt denies SI/HI/AVH. Pt is pleasant and cooperative at this time. Pt continues to be religiously pre occupied. Pt refused blood work and medications due to his beliefs. Pt states " I'm ok, my relationship with GOD will keep me going".  A: Pt was offered support and encouragement. Pt was offered scheduled medications. Pt was encourage to attend groups. Q 15 minute checks were done for safety.    R:Pt attends groups and interacts well with peers and staff. Pt is not taking medication. Pt has no complaints at this time. safety maintained on unit.

## 2012-04-14 NOTE — Progress Notes (Signed)
Patient ID: Marc Summers, male   DOB: December 12, 1960, 51 y.o.   MRN: [id number] Patient presents with depressed and suspicious mood.  He denies any SI/HI/AVH.  He remains noncompliant with his medications and minimally active in groups.  Patient states he is here against his will; he feels that he does not need to be here.  He ambulates by wheelchair due to swelling in his feet.  Patient also has poor hygiene and sometimes soils himself, however, does clean himself.    Continue to monitor medication management and MD orders.  Collaborate with treatment team members regarding patient's POC.  Safety checks completed every 15 minutes per protocol.  Patient's behavior has been appropriate; encourage and support patient.

## 2012-04-14 NOTE — Progress Notes (Signed)
[facility] MD Progress Note  04/14/2012 2:18 PM Marc Summers  MRN:  [id number] Subjective: "Doing great!" Diagnosis:  Delusional disorder NOS  ADL's:  Impaired patient remains foul smelling  Sleep: "fine"  Appetite:  Good  Suicidal Ideation:  denies Homicidal Ideation:  denies AEB (as evidenced by):Patient's verbal response to inquiry, affect and written response on daily self inventory.  Psychiatric Specialty Exam: Review of Systems  Constitutional: Negative.  Negative for fever, chills, weight loss, malaise/fatigue and diaphoresis.  HENT: Negative for congestion and sore throat.   Eyes: Negative for blurred vision, double vision and photophobia.  Respiratory: Negative for cough, shortness of breath and wheezing.   Cardiovascular: Negative for chest pain, palpitations and PND.  Gastrointestinal: Negative for heartburn, nausea, vomiting, abdominal pain, diarrhea and constipation.  Musculoskeletal: Negative for myalgias, joint pain and falls.  Neurological: Negative for dizziness, tingling, tremors, sensory change, speech change, focal weakness, seizures, loss of consciousness, weakness and headaches.  Endo/Heme/Allergies: Negative for polydipsia. Does not bruise/bleed easily.  Psychiatric/Behavioral: Negative for depression, suicidal ideas, hallucinations, memory loss and substance abuse. The patient is not nervous/anxious and does not have insomnia.     Blood pressure 113/72, pulse 92, temperature 98.9 F (37.2 C), temperature source Oral, resp. rate 16, height 5\' 7"  (1.702 m), weight 61.689 kg (136 lb).Body mass index is 21.30 kg/(m^2).  General Appearance: Disheveled  With foul smell  Eye Contact::  Good  Speech:  Clear and Coherent  Volume:  Normal  Mood:  Euthymic  Affect:  Congruent  Thought Process:  Coherent and Goal Directed  Orientation:  Full (Time, Place, and Person)  Thought Content:  WDL  Suicidal Thoughts:  No  Homicidal Thoughts:  No  Memory:  Immediate;    Fair  Judgement:  Intact  Insight:  Present  Psychomotor Activity:  Normal  Concentration:  Fair  Recall:  Fair  Akathisia:  No  Handed:  Left  AIMS (if indicated):     Assets:  Communication Skills Desire for Improvement  Sleep:  Number of Hours: 4.5    Current Medications: Current Facility-Administered Medications  Medication Dose Route Frequency Provider Last Rate Last Dose  . benztropine (COGENTIN) tablet 0.5 mg  0.5 mg Oral BID Mojeed Akintayo      . haloperidol (HALDOL) tablet 2 mg  2 mg Oral BID Mojeed Akintayo      . multivitamin with minerals tablet 1 tablet  1 tablet Oral Daily Ashley Jacobs, RD      . traZODone (DESYREL) tablet 50 mg  50 mg Oral QHS PRN Mojeed Akintayo        Lab Results: No results found for this or any previous visit (from the past 48 hour(s)).  Physical Findings: AIMS: Facial and Oral Movements Muscles of Facial Expression: None, normal Lips and Perioral Area: None, normal Jaw: None, normal Tongue: None, normal,Extremity Movements Upper (arms, wrists, hands, fingers): None, normal Lower (legs, knees, ankles, toes): None, normal, Trunk Movements Neck, shoulders, hips: None, normal, Overall Severity Severity of abnormal movements (highest score from questions above): None, normal Incapacitation due to abnormal movements: None, normal Patient's awareness of abnormal movements (rate only patient's report): No Awareness, Dental Status Current problems with teeth and/or dentures?: No Does patient usually wear dentures?: No  CIWA:    COWS:     Treatment Plan Summary: Daily contact with patient to assess and evaluate symptoms and progress in treatment Medication management  Plan: 1. This patient continues to deny an mental illness at this time.  States he is not taking any medication. 2. 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 He wants to know When he can leave, and states he is planning on returning to  his home on [address] in [place]. 3. Ordered Mg, CMP, and HgbA1c for further evaluation. 4. If all is stable and the patient continues to decline medication at this point, it is reasonable to consider discharge in 24-48 hours.  Medical Decision Making Problem Points:  Established problem, stable/improving (1) Data Points:  Review or order clinical lab tests (1)  I certify that inpatient services furnished can reasonably be expected to improve the patient's condition.  Marc Summers PAC 04/14/2012, 2:18 PM

## 2012-04-14 NOTE — Progress Notes (Signed)
Psychoeducational Group Note  Date:  04/14/2012 Time:  2000  Group Topic/Focus:  Wrap-Up Group:   The focus of this group is to help patients review their daily goal of treatment and discuss progress on daily workbooks.  Participation Level:  Minimal  Participation Quality:  Appropriate  Affect:  Appropriate  Cognitive:  Appropriate  Insight:  None  Engagement in Group:  Limited  Additional Comments:  Pt attended wrap-up group this evening. Pt didn't share much while in group. Pt stated his day being good overall. Pt plans to work towards discharge.   Tremont Gavitt A 04/14/2012, 9:49 PM

## 2012-04-14 NOTE — Progress Notes (Signed)
Psychoeducational Group Note  Date:  04/14/2012 Time:  0930  Group Topic/Focus:  Recovery Goals:   The focus of this group is to identify appropriate goals for recovery and establish a plan to achieve them.  Participation Level:  Active  Participation Quality:  Appropriate  Affect:  Appropriate  Cognitive:  Oriented  Insight:  Engaged  Engagement in Group:  Engaged  Additional Comments:  Pt was very insightful during group this morning.  Samiel Peel E 04/14/2012, 11:20 AM

## 2012-04-15 NOTE — Progress Notes (Signed)
Patient ID: Marc Summers, male   DOB: [birth year], 51 y.o.   MRN: [id number] [facility] MD Progress Note  04/15/2012 9:44 AM Marc Summers  MRN:  [id number] Subjective: "I am doing well, ready to go back home"  Diagnosis:  Delusional disorder NOS  ADL's:  fair  Sleep: better  Appetite:  Good  Suicidal Ideation:  denies Homicidal Ideation:  denies AEB (as evidenced by): Patient reports that he is doing fine, he denies delusional, psychotic or depressive symptoms. Patient is compliant with his medications and has no adverse reactions.  Psychiatric Specialty Exam: Review of Systems  Constitutional: Negative.  Negative for fever, chills, weight loss, malaise/fatigue and diaphoresis.  HENT: Negative for congestion and sore throat.   Eyes: Negative for blurred vision, double vision and photophobia.  Respiratory: Negative for cough, shortness of breath and wheezing.   Cardiovascular: Negative for chest pain, palpitations and PND.  Gastrointestinal: Negative for heartburn, nausea, vomiting, abdominal pain, diarrhea and constipation.  Musculoskeletal: Negative for myalgias, joint pain and falls.  Neurological: Negative for dizziness, tingling, tremors, sensory change, speech change, focal weakness, seizures, loss of consciousness, weakness and headaches.  Endo/Heme/Allergies: Negative for polydipsia. Does not bruise/bleed easily.  Psychiatric/Behavioral: Negative for depression, suicidal ideas, hallucinations, memory loss and substance abuse. The patient is not nervous/anxious and does not have insomnia.     Blood pressure 109/74, pulse 98, temperature 97.6 F (36.4 C), temperature source Oral, resp. rate 16, height 5\' 7"  (1.702 m), weight 61.689 kg (136 lb).Body mass index is 21.30 kg/(m^2).  General Appearance: Disheveled  With foul smell  Eye Contact::  Good  Speech:  Clear and Coherent  Volume:  Normal  Mood:  Euthymic  Affect:  Congruent  Thought Process:  Coherent and Goal  Directed  Orientation:  Full (Time, Place, and Person)  Thought Content:  WDL  Suicidal Thoughts:  No  Homicidal Thoughts:  No  Memory:  Immediate;   Fair  Judgement:  Intact  Insight:  Present  Psychomotor Activity:  Normal  Concentration:  Fair  Recall:  Fair  Akathisia:  No  Handed:  Left  AIMS (if indicated):     Assets:  Communication Skills Desire for Improvement  Sleep:  Number of Hours: 6    Current Medications: Current Facility-Administered Medications  Medication Dose Route Frequency Provider Last Rate Last Dose  . benztropine (COGENTIN) tablet 0.5 mg  0.5 mg Oral BID Trindon Dorton      . haloperidol (HALDOL) tablet 2 mg  2 mg Oral BID Verlie Liotta      . multivitamin with minerals tablet 1 tablet  1 tablet Oral Daily Ashley Jacobs, RD      . traZODone (DESYREL) tablet 50 mg  50 mg Oral QHS PRN Verlon Pischke        Lab Results: No results found for this or any previous visit (from the past 48 hour(s)).  Physical Findings: AIMS: Facial and Oral Movements Muscles of Facial Expression: None, normal Lips and Perioral Area: None, normal Jaw: None, normal Tongue: None, normal,Extremity Movements Upper (arms, wrists, hands, fingers): None, normal Lower (legs, knees, ankles, toes): None, normal, Trunk Movements Neck, shoulders, hips: None, normal, Overall Severity Severity of abnormal movements (highest score from questions above): None, normal Incapacitation due to abnormal movements: None, normal Patient's awareness of abnormal movements (rate only patient's report): No Awareness, Dental Status Current problems with teeth and/or dentures?: No Does patient usually wear dentures?: No  CIWA:    COWS:  Treatment Plan Summary: Daily contact with patient to assess and evaluate symptoms and progress in treatment Medication management  Plan: 1. Will continue current treatment. 2. Patient scheduled for discharge tomorrow  Medical Decision Making Problem  Points:  Established problem, stable/improving (1) Data Points:  Review or order clinical lab tests (1)  I certify that inpatient services furnished can reasonably be expected to improve the patient's condition.  Thedore Mins, MD 04/15/2012, 9:44 AM

## 2012-04-15 NOTE — Progress Notes (Signed)
The focus of this group is to help patients review their daily goal of treatment and discuss progress on daily workbooks. Pt attended the evening session and participated in the discussion when prompted by the Writer. He appeared frustrated at interruptions to the group by his peers, but was able to remain calm and tolerant of their behaviors. Pt reported that he had an overall good day.

## 2012-04-15 NOTE — Progress Notes (Signed)
Psychoeducational Group Note  Date:  04/15/2012 Time:  1100  Group Topic/Focus:  Healthy Communication:   The focus of this group is to discuss communication, barriers to communication, as well as healthy ways to communicate with others.  Participation Level:  Active  Participation Quality:  Appropriate  Affect:  Appropriate  Cognitive:  Appropriate  Insight:  Engaged  Engagement in Group:  Engaged  Additional Comments:  pt was appropriate in answering questions while attending the MHT group.   [name] A 04/15/2012, 1:25 PM

## 2012-04-15 NOTE — Progress Notes (Signed)
Patient ID: Marc Summers, male   DOB: Jul 10, 1960, 51 y.o.   MRN: [id number] D: Pt. Reports that he will be going home tomorrow. Pt. 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 Pt. Reports he has a lawsuit with this company and that's what they keep sending him here. Pt. Story inconsistent from story told the previous night to Clinical research associate. Pt. Says he worked for this company for 35 five years. "but God going to work it out" A: Pt. Encouraged to attend group. Staff will monitor q60min for safety. R: Pt. Is safe on the unit. Pt. attended group.

## 2012-04-15 NOTE — Progress Notes (Signed)
Pt denies SI/HI/AVH. Pt noncompliant with meds. Pt refuses to take meds, stating, "Jesus is my healer not medications". Pt continues to attend groups throughout the day. Pt is engaged on the unit.   Medications offered as ordered per MD. Verbal support given. Pt encouraged to take meds and attend groups. 15 minute checks performed for safety.  Pleasant to others and staff. Paranoid thoughts.

## 2012-04-15 NOTE — Progress Notes (Signed)
Psychoeducational Group Note  Date:  04/15/2012 Time:  1315  Group Topic/Focus:  Mental Health Association  Participation Level:  Active  Participation Quality:  Appropriate and Attentive  Affect:  Appropriate  Cognitive:  Appropriate  Insight:  Engaged  Engagement in Group:  Engaged  Additional Comments:  Pt. Had to be redirected at times for monopolizing group but pt. Was engaged and attentive to the mental health association volunteer.   Ruta Hinds [facility] 04/15/2012, 4:22 PM

## 2012-04-15 NOTE — Progress Notes (Signed)
[facility] Adult Case Management Discharge Plan :  Will you be returning to the same living situation after discharge: Yes,  home At discharge, do you have transportation home?:Yes,  sheriff Do you have the ability to pay for your medications:Yes,  no meds  Release of information consent forms completed and in the chart;  Patient's signature needed at discharge.  Patient to Follow up at: Follow-up Information    Follow up with [facility]. On 04/20/2012. (8AM)    Contact information:   [address]  [place]  [phone]         Patient denies SI/HI:   Yes,  yes    Safety Planning and Suicide Prevention discussed:  Yes,  yes  Ida Rogue 04/15/2012, 4:49 PM

## 2012-04-15 NOTE — Progress Notes (Signed)
[facility] LCSW Aftercare Discharge Planning Group Note  04/15/2012 4:43 PM  Participation Quality:  Attentive  Affect:  Appropriate  Cognitive:  Appropriate  Insight:  Limited  Engagement in Group:  Engaged  Modes of Intervention:  Discussion, Exploration and Socialization  Summary of Progress/Problems: Doreen Salvage I had contacted sheriff office about transportation tomorrow, and he was pleased with this news.  Looking forward to getting back home.  Daryel Gerald B 04/15/2012, 4:43 PM

## 2012-04-16 NOTE — [facility] Suicide Risk Assessment (Signed)
Suicide Risk Assessment  Discharge Assessment     Demographic Factors:  Male and Low socioeconomic status  Mental Status Per Nursing Assessment::   On Admission:  NA  Current Mental Status by Physician: NA  Loss Factors: Financial problems/change in socioeconomic status  Historical Factors: NA  Risk Reduction Factors:   NA  Continued Clinical Symptoms:  Resolving delusions  Cognitive Features That Contribute To Risk:  Closed-mindedness Polarized thinking    Suicide Risk:  Minimal: No identifiable suicidal ideation.  Patients presenting with no risk factors but with morbid ruminations; may be classified as minimal risk based on the severity of the depressive symptoms  Discharge Diagnoses:   AXIS I:  Delusional disorder  AXIS II:  Deferred AXIS III:   Past Medical History  Diagnosis Date   AXIS IV:  other psychosocial or environmental problems and problems related to social environment AXIS V:  61-70 mild symptoms  Plan Of Care/Follow-up recommendations:  Activity:  as tolerated Diet:  healthy Tests:  routine Other:  patient to keep his after care appointment and takes his medications as recommended.  Is patient on multiple antipsychotic therapies at discharge:  No   Has Patient had three or more failed trials of antipsychotic monotherapy by history:  No  Recommended Plan for Multiple Antipsychotic Therapies: N/A  Thedore Mins, MD 04/16/2012, 10:02 AM

## 2012-04-16 NOTE — Progress Notes (Signed)
[facility] Group Notes:  (Counselor/Nursing/MHT/Case Management/Adjunct)  04/16/2012 9:13 AM  Type of Therapy:  Discharge Planning  Participation Level:  Minimal  Participation Quality:  Attentive  Affect:  Flat  Cognitive:  Alert  Insight:  None shared  Engagement in Group:  Limited  Engagement in Therapy:  Lacking  Modes of Intervention:  Clarification and Exploration  Summary of Progress/Problems:  Leticia spent a great deal of time getting out of wheelchair and moving to regular chair thus receiving a great deal of attention. In response to identifying with SI, HI, Depression and/or Anxiety patient reports "Everything is okay" repeatedly.    Clide Dales 04/16/2012, 9:13 AM

## 2012-04-16 NOTE — Progress Notes (Addendum)
Patient ID: Marc Summers, male   DOB: 07/30/1960, 51 y.o.   MRN: [id number] Patient discharged home per MD order.  Patient denies any SI/HI/AVH.  Patient maintains that he was held here against his will and remained noncompliant with medications his entire stay.  He was not given any samples to take home; he did not have any belongings in the search room.  Patient has been cooperative.  Patient transferred via [facility] Sherriffs Department.

## 2012-04-17 NOTE — Discharge Summary (Signed)
Physician Discharge Summary Note  Patient:  Marc Summers is an 51 y.o., male MRN:  [id number] DOB:  05/19/1960 Patient phone:  [phone] (home)  Patient address:   [address] [place] [address],   Date of Admission:  04/10/2012 Date of Discharge: 04/16/2012  Reason for Admission: Delusional disorder  Discharge Diagnoses: Principal Problem:  *Delusional disorder Active Problems:  Unspecified episodic mood disorder Discharge Diagnoses:  AXIS I: Delusional disorder  AXIS II: Deferred  AXIS III:  Past Medical History   Diagnosis  Date   AXIS IV: other psychosocial or environmental problems and problems related to social environment  AXIS V: 61-70 mild symptoms Review of Systems  Constitutional: Negative.  Negative for fever, chills, weight loss, malaise/fatigue and diaphoresis.  HENT: Negative for congestion and sore throat.   Eyes: Negative for blurred vision, double vision and photophobia.  Respiratory: Negative for cough, shortness of breath and wheezing.   Cardiovascular: Negative for chest pain, palpitations and PND.  Gastrointestinal: Negative for heartburn, nausea, vomiting, abdominal pain, diarrhea and constipation.  Musculoskeletal: Negative for myalgias, joint pain and falls.  Neurological: Negative for dizziness, tingling, tremors, sensory change, speech change, focal weakness, seizures, loss of consciousness, weakness and headaches.  Endo/Heme/Allergies: Negative for polydipsia. Does not bruise/bleed easily.  Psychiatric/Behavioral: Negative for depression, suicidal ideas, hallucinations, memory loss and substance abuse. The patient is not nervous/anxious and does not have insomnia.    Level of Care:  OP  Hospital Course:  Mr. Rueb was admitted for delusional disorder after being found by his brother at his home in [place], [place] locked in a closet, wrapped in a sleeping bag, with no lights, electricity or water.  He was malnourished upon arrival at the  [facility]. He was given medical clearance and transferred to [facility].     Mr. Bollman was admitted for crisis management and stabilization. He denied auditory or visual hallucinations, and was fully oriented.  He stated that he was on a healing with God and should be allowed to worship as he wished. Mr. Markowicz declined medication. He was cooperative with staff and did not show signs of aggression or hostility to other patients or staff. Mr. Mccauley complained of swelling in his feet, and was given the use of a wheelchair, but declined any medication. He was able to eat and drink appropriately. Unfortunately he was incontinent of stool and urine, but was able to clean him self up. He declined any further evaluation or treatment for this stating it was his right to refuse treatment. His hygiene continued to be poor. Mr. Prinsen was pleasant and appropriate in his behaviors, oriented to day, date and location.  He attended groups as requested and participated appropriately.  He did not meet criteria for forced medications.  After 5 days he no longer met criteria for further involuntary in patient admission.  He requested to be discharged home to his home in [place] and arrangements were made with the [facility].  He was encouraged to follow up at the nearest emergency room if he should become symptomatic.  Consults:  None  Significant Diagnostic Studies:  None  Discharge Vitals:   Blood pressure 114/74, pulse 98, temperature 97.6 F (36.4 C), temperature source Oral, resp. rate 16, height 5\' 7"  (1.702 m), weight 61.689 kg (136 lb). Body mass index is 21.30 kg/(m^2). Lab Results:   No results found for this or any previous visit (from the past 72 hour(s)).  Physical Findings: AIMS: Facial and Oral Movements Muscles of Facial Expression:  None, normal Lips and Perioral Area: None, normal Jaw: None, normal Tongue: None, normal,Extremity Movements Upper (arms, wrists, hands, fingers): None, normal Lower (legs,  knees, ankles, toes): None, normal, Trunk Movements Neck, shoulders, hips: None, normal, Overall Severity Severity of abnormal movements (highest score from questions above): None, normal Incapacitation due to abnormal movements: None, normal Patient's awareness of abnormal movements (rate only patient's report): No Awareness, Dental Status Current problems with teeth and/or dentures?: No Does patient usually wear dentures?: No  CIWA:    COWS:     Psychiatric Specialty Exam: See Psychiatric Specialty Exam and Suicide Risk Assessment completed by Attending Physician prior to discharge.  Discharge destination:  Home  Is patient on multiple antipsychotic therapies at discharge:  No   Has Patient had three or more failed trials of antipsychotic monotherapy by history:  No  Recommended Plan for Multiple Antipsychotic Therapies: not applicable   Discharge Orders    Future Orders Please Complete By Expires   Diet - low sodium heart healthy      Increase activity slowly      Discharge instructions      Comments:   You have chosen not to take any medications. If you become symptomatic please go to the nearest Emergency department.       Medication List    Notice       You have not been prescribed any medications.            Follow-up Information    Follow up with [facility]. On 04/20/2012. (8AM)    Contact information:   [address]  [place]  [phone]         Follow-up recommendations:  As noted above  Comments:  Total Discharge Time:  >30 minutes  Signed:  Lloyd Huger T. Delyla Sandeen PAC 04/17/2012, 3:38 PM

## 2012-04-20 NOTE — Progress Notes (Signed)
Patient Discharge Instructions:  After Visit Summary (AVS):   Faxed to:  04/20/12 Psychiatric Admission Assessment Note:   Faxed to:  04/20/12 Suicide Risk Assessment - Discharge Assessment:   Faxed to:  04/20/12 Faxed/Sent to the Next Level Care provider:  04/20/12 Faxed to [facility] @ [phone]  Jerelene Redden, 04/20/2012, 4:01 PM

## 2012-04-20 NOTE — Discharge Summary (Signed)
Seen and agreed. Lamoyne Palencia, MD 

## 2012-06-21 ENCOUNTER — Emergency Department (HOSPITAL_COMMUNITY): Payer: Self-pay

## 2012-06-21 ENCOUNTER — Encounter (HOSPITAL_COMMUNITY): Payer: Self-pay | Admitting: Emergency Medicine

## 2012-06-21 ENCOUNTER — Inpatient Hospital Stay (HOSPITAL_COMMUNITY)
Admission: EM | Admit: 2012-06-21 | Discharge: 2012-06-24 | DRG: 885 | Disposition: A | Discharge status: Other Acute Inpatient Hospital | Payer: MEDICAID | Attending: Internal Medicine | Admitting: Internal Medicine

## 2012-06-21 DIAGNOSIS — G9340 Encephalopathy, unspecified: Secondary | ICD-10-CM | POA: Diagnosis present

## 2012-06-21 DIAGNOSIS — R4189 Other symptoms and signs involving cognitive functions and awareness: Secondary | ICD-10-CM

## 2012-06-21 DIAGNOSIS — E871 Hypo-osmolality and hyponatremia: Secondary | ICD-10-CM | POA: Diagnosis present

## 2012-06-21 DIAGNOSIS — Z87891 Personal history of nicotine dependence: Secondary | ICD-10-CM

## 2012-06-21 DIAGNOSIS — T68XXXA Hypothermia, initial encounter: Secondary | ICD-10-CM

## 2012-06-21 DIAGNOSIS — X31XXXA Exposure to excessive natural cold, initial encounter: Secondary | ICD-10-CM | POA: Diagnosis present

## 2012-06-21 DIAGNOSIS — D649 Anemia, unspecified: Secondary | ICD-10-CM | POA: Diagnosis present

## 2012-06-21 DIAGNOSIS — E876 Hypokalemia: Secondary | ICD-10-CM | POA: Diagnosis present

## 2012-06-21 DIAGNOSIS — F39 Unspecified mood [affective] disorder: Secondary | ICD-10-CM | POA: Diagnosis present

## 2012-06-21 DIAGNOSIS — R404 Transient alteration of awareness: Secondary | ICD-10-CM | POA: Diagnosis present

## 2012-06-21 DIAGNOSIS — E46 Unspecified protein-calorie malnutrition: Secondary | ICD-10-CM | POA: Diagnosis present

## 2012-06-21 DIAGNOSIS — F22 Delusional disorders: Principal | ICD-10-CM | POA: Diagnosis present

## 2012-06-21 DIAGNOSIS — IMO0002 Reserved for concepts with insufficient information to code with codable children: Secondary | ICD-10-CM

## 2012-06-21 LAB — CBC WITH DIFFERENTIAL/PLATELET
Basophils Absolute: 0 10*3/uL (ref 0.0–0.1)
Basophils Relative: 0 % (ref 0–1)
Eosinophils Absolute: 0 10*3/uL (ref 0.0–0.7)
Eosinophils Relative: 0 % (ref 0–5)
Lymphs Abs: 1.4 10*3/uL (ref 0.7–4.0)
MCH: 29.4 pg (ref 26.0–34.0)
MCHC: 36.2 g/dL — ABNORMAL HIGH (ref 30.0–36.0)
MCV: 81.3 fL (ref 78.0–100.0)
Platelets: 397 10*3/uL (ref 150–400)
RDW: 13.6 % (ref 11.5–15.5)

## 2012-06-21 LAB — BASIC METABOLIC PANEL
Calcium: 10 mg/dL (ref 8.4–10.5)
GFR calc Af Amer: >90 mL/min (ref >90)
GFR calc non Af Amer: >90 mL/min (ref >90)
Glucose, Bld: 134 mg/dL — ABNORMAL HIGH (ref 70–99)
Sodium: 132 mEq/L — ABNORMAL LOW (ref 135–145)

## 2012-06-21 LAB — SALICYLATE LEVEL: Salicylate Lvl: <2 mg/dL — ABNORMAL LOW (ref 2.8–20.0)

## 2012-06-21 LAB — OCCULT BLOOD, POC DEVICE: Fecal Occult Bld: NEGATIVE

## 2012-06-21 LAB — HEPATIC FUNCTION PANEL
ALT: 8 U/L (ref 0–53)
AST: 19 U/L (ref 0–37)
Bilirubin, Direct: 0.5 mg/dL — ABNORMAL HIGH (ref 0.0–0.3)
Total Bilirubin: 1.4 mg/dL — ABNORMAL HIGH (ref 0.3–1.2)

## 2012-06-21 LAB — MAGNESIUM: Magnesium: 1.9 mg/dL (ref 1.5–2.5)

## 2012-06-21 LAB — ACETAMINOPHEN LEVEL: Acetaminophen (Tylenol), Serum: <15 ug/mL (ref 10–30)

## 2012-06-21 LAB — AMMONIA: Ammonia: 25 umol/L (ref 11–60)

## 2012-06-21 MED ORDER — POTASSIUM CHLORIDE CRYS ER 20 MEQ PO TBCR
40.0000 meq | EXTENDED_RELEASE_TABLET | Freq: Once | ORAL | Status: DC
  Filled 2012-06-21: qty 2

## 2012-06-21 MED ORDER — HALOPERIDOL LACTATE 5 MG/ML IJ SOLN
5.0000 mg | Freq: Four times a day (QID) | INTRAMUSCULAR | Status: DC | PRN
  Administered 2012-06-22: 5 mg via INTRAVENOUS
  Filled 2012-06-21: qty 1

## 2012-06-21 MED ORDER — SORBITOL 70 % SOLN: 30.0000 mL | Freq: Every day | Status: DC | PRN

## 2012-06-21 MED ORDER — SODIUM CHLORIDE 0.9 % IV BOLUS (SEPSIS)
1000.0000 mL | Freq: Once | INTRAVENOUS | Status: AC
  Administered 2012-06-21: 1000 mL via INTRAVENOUS

## 2012-06-21 MED ORDER — ENOXAPARIN SODIUM 40 MG/0.4ML ~~LOC~~ SOLN
40.0000 mg | SUBCUTANEOUS | Status: DC
  Administered 2012-06-22: 40 mg via SUBCUTANEOUS
  Filled 2012-06-21 (×3): qty 0.4

## 2012-06-21 MED ORDER — SODIUM CHLORIDE 0.9 % IV SOLN: INTRAVENOUS | Status: DC

## 2012-06-21 MED ORDER — POTASSIUM CHLORIDE 10 MEQ/100ML IV SOLN
10.0000 meq | INTRAVENOUS | Status: AC
  Administered 2012-06-21 (×3): 10 meq via INTRAVENOUS
  Filled 2012-06-21 (×3): qty 100

## 2012-06-21 MED ORDER — ACETAMINOPHEN 325 MG PO TABS
650.0000 mg | ORAL_TABLET | ORAL | Status: DC | PRN
  Administered 2012-06-22: 650 mg via ORAL
  Filled 2012-06-21: qty 1
  Filled 2012-06-21: qty 2

## 2012-06-21 MED ORDER — POTASSIUM CHLORIDE IN NACL 40-0.9 MEQ/L-% IV SOLN
INTRAVENOUS | Status: DC
  Administered 2012-06-21 – 2012-06-23 (×3): via INTRAVENOUS

## 2012-06-21 MED ORDER — POLYETHYLENE GLYCOL 3350 17 G PO PACK: 17.0000 g | PACK | Freq: Every day | ORAL | Status: DC | PRN

## 2012-06-21 MED ORDER — ONDANSETRON HCL 4 MG/2ML IJ SOLN: 4.0000 mg | Freq: Three times a day (TID) | INTRAMUSCULAR | Status: DC | PRN

## 2012-06-21 MED ORDER — ONDANSETRON HCL 4 MG/2ML IJ SOLN: 4.0000 mg | INTRAMUSCULAR | Status: DC | PRN

## 2012-06-21 MED ORDER — SODIUM CHLORIDE 0.9 % IJ SOLN
3.0000 mL | Freq: Two times a day (BID) | INTRAMUSCULAR | Status: DC
  Administered 2012-06-22 – 2012-06-23 (×3): 3 mL via INTRAVENOUS

## 2012-06-21 NOTE — ED Notes (Signed)
Attempted straight catheter. Unable to pass catheter pass the prostate. Catheter bent. Blood noted upon removing the catheter. Will attempt in a little while with coude catheter.

## 2012-06-21 NOTE — ED Notes (Signed)
Daughter and other family member at bedside. States she last saw patient one week ago. Reports patient's house has no water or electricity and no furnishings. Patient lives alone. Reports patient was in [facility] approximately 3 months ago and per daughter patient discharged with no medications or follow up.   Patient now more alert. Verbal now, denies pain. Confused. Does not remember how he got here or recent events. Answers some questions appropriately, recognized daughter.

## 2012-06-21 NOTE — ED Notes (Signed)
Patient arrives via EMS from home with c/o altered mental status. Patient found by family today in his home, located in the floor of the closet. EMS reports the home is bare (no furnishings, no food) and all of the window and doors were open. Unknown how long patient has been in these conditions. Home cold and patient noted to be shivering. Patient will blink, but non verbal and will not respond to staff. Noted to have fixated stare.   H/o psychosis in past, recent [facility] admit for same. Family is on their way per EMS and RN will attempt to get better history at their arrival.

## 2012-06-21 NOTE — H&P (Signed)
[facility] History and Physical  KYAIR DITOMMASO  AVW:[id number]  DOB: [birth year]   DOA: 06/21/2012   PCP:   No primary provider on file.   Chief Complaint:  Found unresponsive  HPI: Marc Summers is an 52 y.o. male.   Middle-aged [place] American gentleman, well known  in the community to have a mental illness, probably schizophrenia, walks the streets the [place] barefoot in all types of weather; has stripped down his house, and it seems to be related to his paranoid delusions; he was found at home yesterday unresponsive and brought to the emergency room; found to be hypothermic with a temperature of 93 rectally, and his serum chemistry showed a potassium of 2.5. CPK was only mildly elevated.  Patient is known to have no heating in his house, and is known to not keep those on his house  Patient received IV fluids and a warming blanket, and his temperature rapidly came to normal.  At the time of the interview patient is alert but is very guarded in his speech; she appears to understands questions but repeats the same phrases over and over; no nuchal communication is possible; he is showing no signs of distress and makes no complaints  Rewiew of Systems:  Unable to obtain patient does not respond appropriately to interview.     Past Medical History  Diagnosis Date  . Mental disorder     Past Surgical History  Procedure Laterality Date  . Rt foot surgery  2002  . Rt kidney surgery      Medications:  HOME MEDS: Prior to Admission medications   Not on File     Allergies:  No Known Allergies  Social History:   reports that he has quit smoking. He has never used smokeless tobacco. He reports that he uses illicit drugs (Marijuana). He reports that he does not drink alcohol.  Family History: No family history on file. Unable to obtain  Physical Exam: Filed Vitals:   06/21/12 1640 06/21/12 1744 06/21/12 1838 06/21/12 2041  BP: 110/80     Pulse: 123      Temp:  97.7 F (36.5 C) 93 F (33.9 C) 98.2 F (36.8 C)  TempSrc:   Rectal Oral  Resp: 22     SpO2: 100%      Blood pressure 110/80, pulse 123, temperature 98.2 F (36.8 C), temperature source Oral, resp. rate 22, SpO2 100.00%.  GEN:  Middle-aged African American gentleman sitting up in bed staring; does not resist  exam PSYCH: Flat, blunted affect; alert but unresponsive to questioning HEENT: Mucous membranes pink, dry, and anicteric; PERRLA; EOM intact; no cervical lymphadenopathy nor thyromegaly or carotid bruit; no JVD; Breasts:: Not examined CHEST WALL: No tenderness CHEST: Normal respiration, clear to auscultation bilaterally HEART: Tachycardic regular rhythm; no murmurs rubs or gallops BACK: No kyphosis or scoliosis; no CVA tenderness ABDOMEN:  soft non-tender; no masses, no organomegaly, normal abdominal bowel sounds; no pannus; no intertriginous candida. Rectal Exam: Not done EXTREMITIES: No bone or joint deformity; age-appropriate arthropathy of the hands and knees; no edema; no ulcerations. Genitalia: not examined PULSES: 2+ and symmetric SKIN: Normal hydration no rash or ulceration CNS: Cranial nerves 2-12 grossly intact no focal lateralizing neurologic deficit   Labs on Admission:  Basic Metabolic Panel:  Recent Labs Lab 06/21/12 1650  NA 132*  K 2.5*  CL 80*  CO2 22  GLUCOSE 134*  BUN 15  CREATININE 0.66  CALCIUM 10.0   Liver Function Tests:  Recent  Labs Lab 06/21/12 1715  AST 19  ALT 8  ALKPHOS 39  BILITOT 1.4*  PROT 8.2  ALBUMIN 4.1   No results found for this basename: LIPASE, AMYLASE,  in the last 168 hours  Recent Labs Lab 06/21/12 1715  AMMONIA 25   CBC:  Recent Labs Lab 06/21/12 1650  WBC 9.8  NEUTROABS 7.5  HGB 12.3*  HCT 34.0*  MCV 81.3  PLT 397   Cardiac Enzymes:  Recent Labs Lab 06/21/12 1715  CKTOTAL 297*  TROPONINI <0.30   BNP: No components found with this basename: POCBNP,  D-dimer: No components found  with this basename: D-DIMER,  CBG: No results found for this basename: GLUCAP,  in the last 168 hours  Radiological Exams on Admission: Dg Chest 2 View  06/21/2012  *RADIOLOGY REPORT*  Clinical Data: Altered mental status.  CHEST - 2 VIEW  Comparison: 04/07/2012.  Findings: The cardiac silhouette, mediastinal and hilar contours are within normal limits and stable. The lungs are clear.  No pleural effusions.  The bony thorax is intact air  IMPRESSION: Normal chest x-ray.   Original Report Authenticated By: Rudie Meyer, M.D.    Ct Head Wo Contrast  06/21/2012  *RADIOLOGY REPORT*  Clinical Data: Altered mental status.  CT HEAD WITHOUT CONTRAST  Technique:  Contiguous axial images were obtained from the base of the skull through the vertex without contrast.  Comparison: None  Findings: Mild chronic small vessel white matter ischemic changes are noted.  No acute intracranial abnormalities are identified, including mass lesion or mass effect, hydrocephalus, extra-axial fluid collection, midline shift, hemorrhage, or acute infarction.  The visualized bony calvarium is unremarkable.  IMPRESSION: No evidence of acute intracranial abnormality.   Original Report Authenticated By: Harmon Pier, M.D.     EKG: Independently reviewed. Sinus tachycardia nonspecific ST wave   Assessment/Plan Present on Admission:  . Delusional disorder . Unspecified episodic mood disorder Dehydration Hypokalemia due to dehydration Hyponatremia due to dehydration  PLAN: We'll admit this gentleman for hydration; repletion of potassium and sodium, and order when necessary antipsychoticss He will likely need a psychiatrist's he gets through to him.  Check urine for toxic toxins and infection  Other plans as per orders.  Code Status: FULL CODE  Family Communication: Unable Disposition Plan: Depending on psychiatric; will likely need inpatient commitment  Sumiya Mamaril Nocturnist [facility] Pager [phone]    06/21/2012, 10:14 PM

## 2012-06-21 NOTE — ED Provider Notes (Signed)
History     CSN: [id number]  Arrival date & time 06/21/12  1636   First MD Initiated Contact with Patient 06/21/12 1702      Chief Complaint  Patient presents with  . Altered Mental Status    (Consider location/radiation/quality/duration/timing/severity/associated sxs/prior treatment) HPI Comments: Patient brought in by EMS after being found unresponsive by his family. Family states he was laying on the floor of his house responding only to painful stimuli.Marland Kitchen He is awake and alert and appropriately following commands. Family states they last saw him a week ago. He has not been eating or drinking well. His house is unfurnished and he has no running water or electricity. Family reports patient has no medical problems but does have a history of unspecified delusional disorder with admission in December. He denies any alcohol or drug use. Denies any falls or injuries. He is oriented to himself and place. He thinks today is Monday.  The history is provided by the patient, the EMS personnel and a relative. The history is limited by the condition of the patient.    Past Medical History  Diagnosis Date  . Mental disorder     Past Surgical History  Procedure Laterality Date  . Rt foot surgery  2002  . Rt kidney surgery      No family history on file.  History  Substance Use Topics  . Smoking status: Former Games developer  . Smokeless tobacco: Never Used  . Alcohol Use: No      Review of Systems  Unable to perform ROS: Psychiatric disorder  Psychiatric/Behavioral: Positive for altered mental status.    Allergies  Review of patient's allergies indicates no known allergies.  Home Medications  No current outpatient prescriptions on file.  BP 113/78  Pulse 95  Temp(Src) 98.2 F (36.8 C) (Oral)  Resp 22  Ht 5\' 10"  (1.778 m)  Wt 132 lb 11.5 oz (60.2 kg)  BMI 19.04 kg/m2  SpO2 100%  Physical Exam  Constitutional: He is oriented to person, place, and time. He appears  well-developed and well-nourished.  Cachectic and malnourished  HENT:  Head: Normocephalic and atraumatic.  Mouth/Throat: No oropharyngeal exudate.  Dry mucous membranes  Eyes: Conjunctivae and EOM are normal. Pupils are equal, round, and reactive to light.  Neck: Normal range of motion. Neck supple.  Cardiovascular: Normal rate, regular rhythm and normal heart sounds.   Tachycardic  Pulmonary/Chest: Effort normal and breath sounds normal. No respiratory distress.  Abdominal: Soft. There is no tenderness. There is no rebound and no guarding.  Musculoskeletal: Normal range of motion. He exhibits no edema and no tenderness.  Neurological: He is alert and oriented to person, place, and time. No cranial nerve deficit. He exhibits normal muscle tone. Coordination normal.  Skin: Skin is warm.    ED Course  Procedures (including critical care time)  Labs Reviewed  CBC WITH DIFFERENTIAL - Abnormal; Notable for the following:    RBC 4.18 (*)    Hemoglobin 12.3 (*)    HCT 34.0 (*)    MCHC 36.2 (*)    All other components within normal limits  BASIC METABOLIC PANEL - Abnormal; Notable for the following:    Sodium 132 (*)    Potassium 2.5 (*)    Chloride 80 (*)    Glucose, Bld 134 (*)    All other components within normal limits  SALICYLATE LEVEL - Abnormal; Notable for the following:    Salicylate Lvl <2.0 (*)    All other components within  normal limits  CK - Abnormal; Notable for the following:    Total CK 297 (*)    All other components within normal limits  LACTIC ACID, PLASMA - Abnormal; Notable for the following:    Lactic Acid, Venous 5.2 (*)    All other components within normal limits  HEPATIC FUNCTION PANEL - Abnormal; Notable for the following:    Total Bilirubin 1.4 (*)    Bilirubin, Direct 0.5 (*)    All other components within normal limits  MRSA PCR SCREENING  ETHANOL  ACETAMINOPHEN LEVEL  TROPONIN I  AMMONIA  PROTIME-INR  MAGNESIUM  URINALYSIS, ROUTINE W  REFLEX MICROSCOPIC  URINE RAPID DRUG SCREEN (HOSP PERFORMED)  TSH  T4, FREE  CBC  COMPREHENSIVE METABOLIC PANEL  OCCULT BLOOD, POC DEVICE   Dg Chest 2 View  06/21/2012  *RADIOLOGY REPORT*  Clinical Data: Altered mental status.  CHEST - 2 VIEW  Comparison: 04/07/2012.  Findings: The cardiac silhouette, mediastinal and hilar contours are within normal limits and stable. The lungs are clear.  No pleural effusions.  The bony thorax is intact air  IMPRESSION: Normal chest x-ray.   Original Report Authenticated By: Rudie Meyer, M.D.    Ct Head Wo Contrast  06/21/2012  *RADIOLOGY REPORT*  Clinical Data: Altered mental status.  CT HEAD WITHOUT CONTRAST  Technique:  Contiguous axial images were obtained from the base of the skull through the vertex without contrast.  Comparison: None  Findings: Mild chronic small vessel white matter ischemic changes are noted.  No acute intracranial abnormalities are identified, including mass lesion or mass effect, hydrocephalus, extra-axial fluid collection, midline shift, hemorrhage, or acute infarction.  The visualized bony calvarium is unremarkable.  IMPRESSION: No evidence of acute intracranial abnormality.   Original Report Authenticated By: Harmon Pier, M.D.      1. Encephalopathy   2. Hypokalemia   3. Hypothermia       MDM  Altered mental status from home with poor living situation.  History of psychosis and delusions, not apparently taken meds.  Alert, disheveled, tachycardic, foul smelling.   Labs remarkable for hypokalemia, elevated lactate, hypothermia. CT neg. CXR negative.  Patient hypervigilant and out of touch with reality.  APS informed by nursing staff of patients deplorable living conditions. Medical admission for IVF, correction of hypokalemia, monitoring of temperature.  Bear hugger placed.    Date: 06/21/2012  Rate: 109  Rhythm: sinus tachycardia  QRS Axis: normal  Intervals: normal  ST/T Wave abnormalities: nonspecific ST/T  changes  Conduction Disutrbances:none  Narrative Interpretation:   Old EKG Reviewed: unchanged          Glynn Octave, MD 06/21/12 [phone]

## 2012-06-21 NOTE — ED Notes (Signed)
CRITICAL VALUE ALERT  Critical value received:  Potassium  Date of notification:  06/21/12  Time of notification:  1755  Critical value read back:yes  Nurse who received alert:  Lake Bells, RN  MD notified (1st page):  Dr Manus Gunning  Time of first page:  [phone]

## 2012-06-21 NOTE — ED Notes (Addendum)
Rectal temp 93 degrees. More warm blankets provided to patient. Dr Manus Gunning informed.

## 2012-06-21 NOTE — ED Notes (Signed)
Patient turns head and refusing to take PO potassium in applesauce.

## 2012-06-21 NOTE — ED Notes (Signed)
APS contacted about patient living conditions. On call to pass information on to APS supervisor. States that someone from APS will be in contact with the ED in the morning. RN to call back to give disposition of patient when decided by MD.

## 2012-06-22 DIAGNOSIS — R404 Transient alteration of awareness: Secondary | ICD-10-CM | POA: Diagnosis present

## 2012-06-22 LAB — T4, FREE: Free T4: 1.39 ng/dL (ref 0.80–1.80)

## 2012-06-22 LAB — FERRITIN: Ferritin: 870 ng/mL — ABNORMAL HIGH (ref 22–322)

## 2012-06-22 LAB — URINALYSIS, ROUTINE W REFLEX MICROSCOPIC
Glucose, UA: NEGATIVE mg/dL
Ketones, ur: >80 mg/dL — AB

## 2012-06-22 LAB — COMPREHENSIVE METABOLIC PANEL
ALT: 7 U/L (ref 0–53)
AST: 18 U/L (ref 0–37)
Albumin: 3.5 g/dL (ref 3.5–5.2)
Alkaline Phosphatase: 33 U/L — ABNORMAL LOW (ref 39–117)
CO2: 23 mEq/L (ref 19–32)
Chloride: 92 mEq/L — ABNORMAL LOW (ref 96–112)
GFR calc non Af Amer: >90 mL/min (ref >90)
Potassium: 3 mEq/L — ABNORMAL LOW (ref 3.5–5.1)
Sodium: 133 mEq/L — ABNORMAL LOW (ref 135–145)
Total Bilirubin: 1 mg/dL (ref 0.3–1.2)

## 2012-06-22 LAB — IRON AND TIBC
Saturation Ratios: 34 % (ref 20–55)
UIBC: 119 ug/dL — ABNORMAL LOW (ref 125–400)

## 2012-06-22 LAB — URINE MICROSCOPIC-ADD ON

## 2012-06-22 LAB — CBC
MCV: 83 fL (ref 78.0–100.0)
Platelets: 224 10*3/uL (ref 150–400)
RBC: 2.7 MIL/uL — ABNORMAL LOW (ref 4.22–5.81)
RDW: 13.9 % (ref 11.5–15.5)
WBC: 5.2 10*3/uL (ref 4.0–10.5)

## 2012-06-22 LAB — RETICULOCYTES
RBC.: 3.37 MIL/uL — ABNORMAL LOW (ref 4.22–5.81)
Retic Count, Absolute: 13.5 10*3/uL — ABNORMAL LOW (ref 19.0–186.0)

## 2012-06-22 LAB — RAPID URINE DRUG SCREEN, HOSP PERFORMED
Amphetamines: NOT DETECTED
Tetrahydrocannabinol: NOT DETECTED

## 2012-06-22 LAB — OCCULT BLOOD X 1 CARD TO LAB, STOOL: Fecal Occult Bld: NEGATIVE

## 2012-06-22 MED ORDER — HALOPERIDOL LACTATE 5 MG/ML IJ SOLN: 2.0000 mg | Freq: Four times a day (QID) | INTRAMUSCULAR | Status: DC | PRN

## 2012-06-22 MED ORDER — TAB-A-VITE/IRON PO TABS
1.0000 | ORAL_TABLET | Freq: Every day | ORAL | Status: DC
  Filled 2012-06-22 (×6): qty 1

## 2012-06-22 MED ORDER — FOLIC ACID 1 MG PO TABS
1.0000 mg | ORAL_TABLET | Freq: Every day | ORAL | Status: DC
  Filled 2012-06-22 (×2): qty 1

## 2012-06-22 NOTE — Progress Notes (Signed)
Subjective: The patient is currently asleep. He is arousable to name. He denies chest pain or shortness of breath. It is noted early this morning for some agitative behavior. He was given 5 mg of Haldol.  Objective: Vital signs in last 24 hours: Filed Vitals:   06/22/12 0500 06/22/12 0600 06/22/12 0700 06/22/12 0800  BP: 94/65 98/75 101/77   Pulse:      Temp:    98.3 F (36.8 C)  TempSrc:    Oral  Resp: 15 16 17    Height:      Weight: 60.2 kg (132 lb 11.5 oz)     SpO2:        Intake/Output Summary (Last 24 hours) at 06/22/12 1191 Last data filed at 06/22/12 0700  Gross per 24 hour  Intake 2337.91 ml  Output    200 ml  Net 2137.91 ml    Weight change:   Physical exam: General: Debilitated-appearing 52 year old African-American man who appears older than his stated age. He is sleeping but arousable. Lungs: clear anteriorly with decreased breath sounds in the bases. Heart: S1, S2, with borderline tachycardia. Abdomen: Hypoactive bowel sounds, soft, nontender, nondistended. Extremities: No pedal edema. Neurologic: He is sleeping and then becomes lethargic. He opens his eyes to voice. He gives me his address, but keeps saying his address over and over again with every question. Cranial nerves II through XII appear to be grossly intact. He is moving all of his extremities spontaneously.  Lab Results: Basic Metabolic Panel:  Recent Labs  47/82/95 1650 06/21/12 1751 06/22/12 0436  NA 132*  --  133*  K 2.5*  --  3.0*  CL 80*  --  92*  CO2 22  --  23  GLUCOSE 134*  --  103*  BUN 15  --  12  CREATININE 0.66  --  0.61  CALCIUM 10.0  --  8.6  MG  --  1.9  --    Liver Function Tests:  Recent Labs  06/21/12 1715 06/22/12 0436  AST 19 18  ALT 8 7  ALKPHOS 39 33*  BILITOT 1.4* 1.0  PROT 8.2 6.8  ALBUMIN 4.1 3.5   No results found for this basename: LIPASE, AMYLASE,  in the last 72 hours  Recent Labs  06/21/12 1715  AMMONIA 25   CBC:  Recent Labs   06/21/12 1650 06/22/12 0436 06/22/12 0729  WBC 9.8 5.2  --   NEUTROABS 7.5  --   --   HGB 12.3* 7.7* 9.8*  HCT 34.0* 22.4* 27.4*  MCV 81.3 83.0  --   PLT 397 224  --    Cardiac Enzymes:  Recent Labs  06/21/12 1715  CKTOTAL 297*  TROPONINI <0.30   BNP: No results found for this basename: PROBNP,  in the last 72 hours D-Dimer: No results found for this basename: DDIMER,  in the last 72 hours CBG: No results found for this basename: GLUCAP,  in the last 72 hours Hemoglobin A1C: No results found for this basename: HGBA1C,  in the last 72 hours Fasting Lipid Panel: No results found for this basename: CHOL, HDL, LDLCALC, TRIG, CHOLHDL, LDLDIRECT,  in the last 72 hours Thyroid Function Tests: No results found for this basename: TSH, T4TOTAL, FREET4, T3FREE, THYROIDAB,  in the last 72 hours Anemia Panel:  Recent Labs  06/22/12 0729  RETICCTPCT 0.4   Coagulation:  Recent Labs  06/21/12 1715  LABPROT 13.3  INR 1.02   Urine Drug Screen: Drugs of Abuse  Component Value Date/Time   LABOPIA NONE DETECTED 04/07/2012 1610   COCAINSCRNUR NONE DETECTED 04/07/2012 1610   LABBENZ NONE DETECTED 04/07/2012 1610   AMPHETMU NONE DETECTED 04/07/2012 1610   THCU NONE DETECTED 04/07/2012 1610   LABBARB NONE DETECTED 04/07/2012 1610    Alcohol Level:  Recent Labs  06/21/12 1650  ETH <11   Urinalysis: No results found for this basename: COLORURINE, APPERANCEUR, LABSPEC, PHURINE, GLUCOSEU, HGBUR, BILIRUBINUR, KETONESUR, PROTEINUR, UROBILINOGEN, NITRITE, LEUKOCYTESUR,  in the last 72 hours Misc. Labs:   Micro: Recent Results (from the past 240 hour(s))  MRSA PCR SCREENING     Status: None   Collection Time    06/21/12  8:44 PM      Result Value Range Status   MRSA by PCR NEGATIVE  NEGATIVE Final   Comment:            The GeneXpert MRSA Assay (FDA     approved for NASAL specimens     only), is one component of a     comprehensive MRSA colonization     surveillance  program. It is not     intended to diagnose MRSA     infection nor to guide or     monitor treatment for     MRSA infections.    Studies/Results: Dg Chest 2 View  06/21/2012  *RADIOLOGY REPORT*  Clinical Data: Altered mental status.  CHEST - 2 VIEW  Comparison: 04/07/2012.  Findings: The cardiac silhouette, mediastinal and hilar contours are within normal limits and stable. The lungs are clear.  No pleural effusions.  The bony thorax is intact air  IMPRESSION: Normal chest x-ray.   Original Report Authenticated By: Rudie Meyer, M.D.    Ct Head Wo Contrast  06/21/2012  *RADIOLOGY REPORT*  Clinical Data: Altered mental status.  CT HEAD WITHOUT CONTRAST  Technique:  Contiguous axial images were obtained from the base of the skull through the vertex without contrast.  Comparison: None  Findings: Mild chronic small vessel white matter ischemic changes are noted.  No acute intracranial abnormalities are identified, including mass lesion or mass effect, hydrocephalus, extra-axial fluid collection, midline shift, hemorrhage, or acute infarction.  The visualized bony calvarium is unremarkable.  IMPRESSION: No evidence of acute intracranial abnormality.   Original Report Authenticated By: Harmon Pier, M.D.     Medications:  Scheduled: . enoxaparin (LOVENOX) injection  40 mg Subcutaneous Q24H  . potassium chloride SA  40 mEq Oral Once  . sodium chloride  3 mL Intravenous Q12H   Continuous: . 0.9 % NaCl with KCl 40 mEq / L 100 mL/hr at 06/22/12 0700   ZOX:[id number], haloperidol lactate, ondansetron (ZOFRAN) IV, polyethylene glycol, sorbitol  Assessment: Principal Problem:   Unresponsive episode Active Problems:   Hypothermia   Acute encephalopathy   Delusional disorder   Unspecified episodic mood disorder   Hypokalemia   Anemia    1. Unresponsiveness/acute encephalopathy. Etiology unclear at this time, but is likely associated with hypothermia and electrolyte abnormalities in the  setting of underlying psychiatric disorder. No obvious infectious etiology, but he is urinalysis is pending. CT of his head is nonacute. His chest x-ray reveals no acute findings.  Delusional disorder. Apparently the patient was treated at behavioral health and December 2013 for a delusional disorder. Apparently he was not discharged on any psychiatric medications.  Hypothermia. Likely secondary to environmental cold temperatures as the patient apparently has no heat or electricity in his home. Resolved status post warming device.  Hyponatremia. Likely secondary  to hypovolemia. Continue normal saline infusion.  Hypokalemia. Magnesium level is within normal limits. We'll continue repletion intravenously in the IV fluids and orally when he is more oriented.  Anemia. His hemoglobin fell from 12.3-9.8 (7.7 was a mistake). The decrease in his hemoglobin is likely dilutional.    Plan:  1. We'll continue normal saline with potassium chloride added. 2. We'll order a urinalysis to assess for infection. 3. Social work consult. Will try to discuss the patient's baseline with family when they are available. 4. Start clear liquid diet and advance as tolerated. 5. We'll get a tele-psychiatric consult when the patient is more clinically stable. 6. Will assess his thyroid function. We'll order an anemia panel. Will guaiac his stools. We'll check the urinalysis and urine drug screen pending. 7. We'll start multivitamin empirically.       Total time: 40 minutes.   LOS: 1 day   Tahisha Hakim 06/22/2012, 8:37 AM

## 2012-06-22 NOTE — Progress Notes (Signed)
Patient taken up stairs to [address] via bed.

## 2012-06-22 NOTE — Clinical Social Work Note (Signed)
CSW reviewed [facility] discharge records from previous hospitalization.  CSW notes that patient was not discharged on any medications and patient had declined to take any psychiatric medications.  Santa Genera, LCSW Clinical Social Worker [phone])

## 2012-06-22 NOTE — Progress Notes (Signed)
Patient is having moments where he states a few words when initially he was non verbal; stated "it goes where it goes" when asked what goes he said "the tree" I repeated the phrase back to him and he said yes. Then stated it was "psychological" and I asked him who and he said himself. When asked if he was supposed to take medications he said yes and when I asked why he didn't he said "I forget". Most other times he is staring forward and doesn't speak at all even when spoken to directly. Patient was ordered to be placed on suicide precautions and they have been implemented at this time.

## 2012-06-22 NOTE — Progress Notes (Signed)
UR Chart Review Completed  
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
## 2012-06-22 NOTE — Progress Notes (Addendum)
RN was sitting for NT for a break; patient was laying quietly in bed until suddenly sat up in bed and swung legs over side and stated firmly "get out of my room" Another nurse came in and asked if she could help and he stated again "get out of my room". Both nurses went to doorway. Whenever the nurses tried to attempt to come back into room she stated "no"  NT came back and attempted to calm patient and get him back into bed. Haldol 5mg  IV PRN was given due to agitation. Patient is continuing to get OOB at this time. Will continue to monitor.  Patient is complaining of pain also at this time. Will offer tylenol

## 2012-06-23 DIAGNOSIS — D649 Anemia, unspecified: Secondary | ICD-10-CM

## 2012-06-23 LAB — CK TOTAL AND CKMB (NOT AT ARMC)
Relative Index: 1 (ref 0.0–2.5)
Total CK: 302 U/L — ABNORMAL HIGH (ref 7–232)

## 2012-06-23 LAB — COMPREHENSIVE METABOLIC PANEL
AST: 16 U/L (ref 0–37)
Albumin: 3 g/dL — ABNORMAL LOW (ref 3.5–5.2)
BUN: 7 mg/dL (ref 6–23)
Calcium: 8.4 mg/dL (ref 8.4–10.5)
Creatinine, Ser: 0.58 mg/dL (ref 0.50–1.35)
Total Bilirubin: 0.6 mg/dL (ref 0.3–1.2)
Total Protein: 6 g/dL (ref 6.0–8.3)

## 2012-06-23 LAB — CBC
Hemoglobin: 8.5 g/dL — ABNORMAL LOW (ref 13.0–17.0)
MCH: 28.9 pg (ref 26.0–34.0)
MCV: 82.3 fL (ref 78.0–100.0)
Platelets: 232 10*3/uL (ref 150–400)
RBC: 2.94 MIL/uL — ABNORMAL LOW (ref 4.22–5.81)
WBC: 3.9 10*3/uL — ABNORMAL LOW (ref 4.0–10.5)

## 2012-06-23 NOTE — Care Management Note (Unsigned)
    Page 1 of 1   06/23/2012     2:07:22 PM   CARE MANAGEMENT NOTE 06/23/2012  Patient:  Marc Summers, Marc Summers   Account Number:  [id number]  Date Initiated:  06/23/2012  Documentation initiated by:  Rosemary Holms  Subjective/Objective Assessment:   Pt admitted from home alone. Living conditions poor from report given. CSW is following the pt's progress for possible long term placement. Per CSW, daughter is pursuing guardianship.     Action/Plan:   Anticipated DC Date:     Anticipated DC Plan:    In-house referral  Clinical Social Leisure centre manager      DC Planning Services  CM consult      Choice offered to / List presented to:             Status of service:  In process, will continue to follow Medicare Important Message given?   (If response is "NO", the following Medicare IM given date fields will be blank) Date Medicare IM given:   Date Additional Medicare IM given:    Discharge Disposition:    Per UR Regulation:    If discussed at Long Length of Stay Meetings, dates discussed:    Comments:  06/23/12 Rosemary Holms RN BSN CM

## 2012-06-23 NOTE — Progress Notes (Signed)
[facility] PROGRESS NOTE  Marc [name] DOB: [birth year] DOA: 06/21/2012 PCP: No primary provider on file.  Assessment/Plan: 1.  Unresponsiveness/acute encephalopathy. Etiology unclear at this time, but is likely associated with hypothermia and electrolyte abnormalities in the setting of underlying psychiatric disorder. It appears that his mental status has likely returned to his baseline. CT of his head is nonacute. His chest x-ray reveals no acute findings.   Delusional disorder. Apparently the patient was treated at behavioral health and December 2013 for a delusional disorder. Apparently he was not discharged on any psychiatric medications since he refused to take them.  We have requested ACT team to see the patient as well as telepsychiatry consultation to help determine capacity, disposition and medication management.  Hypothermia. Likely secondary to environmental cold temperatures as the patient apparently has no heat or electricity in his home. Resolved status post warming device.   Hyponatremia. Likely secondary to hypovolemia. Resolved with saline infusion.  Hypokalemia. Corrected. Magnesium level is within normal limits.   Anemia. His hemoglobin fell from 12.3-8.5. The decrease in his hemoglobin is likely dilutional. No evidence of bleeding. Can be followed as an outpatient.  Code Status: Full code Family Communication: tried calling daughter Marc Summers but unable to reach her over the phone Disposition Plan: to be determined   Consultants:  none  Procedures:  none  Antibiotics:  none  HPI/Subjective: Patient shakes his head no, does not answer any other questions, does not allow examination, per nursing staff has been refusing meds.  Objective: Filed Vitals:   06/22/12 1040 06/22/12 2126 06/23/12 0530 06/23/12 1000  BP: 96/68 102/64 100/64 97/66  Pulse: 86 79 70 68  Temp: 97.1 F (36.2 C) 98.3 F (36.8 C) 98.4 F (36.9 C) 97.6 F (36.4  C)  TempSrc:    Axillary  Resp: 17 18 18 17   Height:      Weight:      SpO2: 99% 99% 99% 99%    Intake/Output Summary (Last 24 hours) at 06/23/12 1407 Last data filed at 06/23/12 0956  Gross per 24 hour  Intake    240 ml  Output   1800 ml  Net  -1560 ml   Filed Weights   06/21/12 2252 06/22/12 0500  Weight: 60.2 kg (132 lb 11.5 oz) 60.2 kg (132 lb 11.5 oz)    Exam:   General:  No distress, awake Did not allow further examination  Data Reviewed: Basic Metabolic Panel:  Recent Labs Lab 06/21/12 1650 06/21/12 1751 06/22/12 0436 06/23/12 0434  NA 132*  --  133* 138  K 2.5*  --  3.0* 3.2*  CL 80*  --  92* 103  CO2 22  --  23 26  GLUCOSE 134*  --  103* 125*  BUN 15  --  12 7  CREATININE 0.66  --  0.61 0.58  CALCIUM 10.0  --  8.6 8.4  MG  --  1.9  --   --    Liver Function Tests:  Recent Labs Lab 06/21/12 1715 06/22/12 0436 06/23/12 0434  AST 19 18 16   ALT 8 7 6   ALKPHOS 39 33* 34*  BILITOT 1.4* 1.0 0.6  PROT 8.2 6.8 6.0  ALBUMIN 4.1 3.5 3.0*   No results found for this basename: LIPASE, AMYLASE,  in the last 168 hours  Recent Labs Lab 06/21/12 1715  AMMONIA 25   CBC:  Recent Labs Lab 06/21/12 1650 06/22/12 0436 06/22/12 0729 06/23/12 0434  WBC 9.8 5.2  --  3.9*  NEUTROABS 7.5  --   --   --   HGB 12.3* 7.7* 9.8* 8.5*  HCT 34.0* 22.4* 27.4* 24.2*  MCV 81.3 83.0  --  82.3  PLT 397 224  --  232   Cardiac Enzymes:  Recent Labs Lab 06/21/12 1715 06/23/12 0434  CKTOTAL 297* 302*  CKMB  --  2.9  TROPONINI <0.30  --    BNP (last 3 results) No results found for this basename: PROBNP,  in the last 8760 hours CBG: No results found for this basename: GLUCAP,  in the last 168 hours  Recent Results (from the past 240 hour(s))  MRSA PCR SCREENING     Status: None   Collection Time    06/21/12  8:44 PM      Result Value Range Status   MRSA by PCR NEGATIVE  NEGATIVE Final   Comment:            The GeneXpert MRSA Assay (FDA     approved  for NASAL specimens     only), is one component of a     comprehensive MRSA colonization     surveillance program. It is not     intended to diagnose MRSA     infection nor to guide or     monitor treatment for     MRSA infections.     Studies: Dg Chest 2 View  06/21/2012  *RADIOLOGY REPORT*  Clinical Data: Altered mental status.  CHEST - 2 VIEW  Comparison: 04/07/2012.  Findings: The cardiac silhouette, mediastinal and hilar contours are within normal limits and stable. The lungs are clear.  No pleural effusions.  The bony thorax is intact air  IMPRESSION: Normal chest x-ray.   Original Report Authenticated By: Rudie Meyer, M.D.    Ct Head Wo Contrast  06/21/2012  *RADIOLOGY REPORT*  Clinical Data: Altered mental status.  CT HEAD WITHOUT CONTRAST  Technique:  Contiguous axial images were obtained from the base of the skull through the vertex without contrast.  Comparison: None  Findings: Mild chronic small vessel white matter ischemic changes are noted.  No acute intracranial abnormalities are identified, including mass lesion or mass effect, hydrocephalus, extra-axial fluid collection, midline shift, hemorrhage, or acute infarction.  The visualized bony calvarium is unremarkable.  IMPRESSION: No evidence of acute intracranial abnormality.   Original Report Authenticated By: Harmon Pier, M.D.     Scheduled Meds: . enoxaparin (LOVENOX) injection  40 mg Subcutaneous Q24H  . folic acid  1 mg Oral Daily  . multivitamins with iron  1 tablet Oral Daily  . sodium chloride  3 mL Intravenous Q12H   Continuous Infusions:   Principal Problem:   Unresponsive episode Active Problems:   Delusional disorder   Unspecified episodic mood disorder   Hypokalemia   Hypothermia   Anemia   Acute encephalopathy    Time spent:    Marc Summers  [facility] Pager [phone]. If 8PM-8AM, please contact night-coverage at [email or web address], password [facility] 06/23/2012, 2:07 PM  LOS: 2 days

## 2012-06-23 NOTE — Clinical Social Work Note (Signed)
CSW received call from daughter, says she has contacted lawyer and wants to pursue guardianship of father.    Santa Genera, LCSW Clinical Social Worker [phone])

## 2012-06-24 MED ORDER — ENSURE COMPLETE PO LIQD
237.0000 mL | Freq: Two times a day (BID) | ORAL | Status: DC
  Administered 2012-06-24: 237 mL via ORAL

## 2012-06-24 NOTE — Discharge Summary (Signed)
Physician Discharge Summary  Marc Summers UUV:[id number] DOB: 09-13-1960 DOA: 06/21/2012  PCP: No primary provider on file.  Admit date: 06/21/2012 Discharge date: 06/24/2012  Time spent: 40 minutes  Recommendations for Outpatient Follow-up:  1. Patient will be transferred to inpatient psychiatry for continued treatment 2. He will need a repeat BMET and CBC in four days to follow serum potassium and hemoglobin, respectively.  Discharge Diagnoses:  Principal Problem:   Unresponsive episode Active Problems:   Delusional disorder   Unspecified episodic mood disorder   Hypokalemia   Hypothermia   Anemia   Acute encephalopathy   Discharge Condition: improved  Diet recommendation: regular diet  Filed Weights   06/21/12 2252 06/22/12 0500 06/24/12 0628  Weight: 60.2 kg (132 lb 11.5 oz) 60.2 kg (132 lb 11.5 oz) 63.1 kg (139 lb 1.8 oz)    History of present illness:  Marc Summers is an 52 y.o. male. Middle-aged [place] American gentleman, well known in the community to have a mental illness, probably schizophrenia, walks the streets the [place] barefoot in all types of weather; has stripped down his house, and it seems to be related to his paranoid delusions; he was found at home yesterday unresponsive and brought to the emergency room; found to be hypothermic with a temperature of 93 rectally, and his serum chemistry showed a potassium of 2.5. CPK was only mildly elevated.  Patient is known to have no heating in his house, and is known to not keep those on his house  Patient received IV fluids and a warming blanket, and his temperature rapidly came to normal.  At the time of the interview patient is alert but is very guarded in his speech; she appears to understands questions but repeats the same phrases over and over; no nuchal communication is possible; he is showing no signs of distress and makes no complaints   Hospital Course:  Patient was admitted to the hospital after  he was found unresponsive at his home. He was found to be hypothermic with a rectal temperature of 93. Serum potassium was found to be 2.5. He is known mental illness who was recently at behavioral health and December 2013. He was discharged home without any medications since he had refused to take him. It was felt that he did have capacity to refuse medications at that time. Patient was brought to the hospital was treated for his medical issues. His hypothermia resolved after rewarming techniques were applied. His electrolytes were corrected. It was noted that he had a significant anemia. Stool occult blood was apparently negative and anemia panel indicated a chronic process. He did not have any evidence of bleeding. She was felt that his anemia was likely dilutional as well as possible chronic process. He will need repeat CBC in 4-5 days. His potassium was also corrected and this was then followed up in 4-5 days to ensure stability. His mental status has returned to his baseline  Regarding his delusional/paranoid disorder, he was evaluated by telepsychiatry who felt that the patient lacks capacity to make informed decisions. Inpatient psychiatric care was recommended and he'll be transferred there later today. He'll be placed on involuntary commitment. His family has been informed and agrees with this plan.  Procedures:  none  Consultations:  Telepsychiatry  ACT team  Discharge Exam: Filed Vitals:   06/24/12 0701 06/24/12 0942 06/24/12 1004 06/24/12 1426  BP: 120/85  116/80 112/81  Pulse: 89 122 75 96  Temp:   98.6 F (37 C) 98.3  F (36.8 C)  TempSrc:   Oral Oral  Resp:   20 20  Height:      Weight:      SpO2: 100% 97% 100% 100%    General: NAD Cardiovascular: S1, S2, RRR Respiratory: CTA B  Discharge Instructions  Discharge Orders   Future Orders Complete By Expires     Diet - low sodium heart healthy  As directed     Increase activity slowly  As directed          Medication List     As of 06/24/2012  3:44 PM    Notice      You have not been prescribed any medications.           The results of significant diagnostics from this hospitalization (including imaging, microbiology, ancillary and laboratory) are listed below for reference.    Significant Diagnostic Studies: Dg Chest 2 View  06/21/2012  *RADIOLOGY REPORT*  Clinical Data: Altered mental status.  CHEST - 2 VIEW  Comparison: 04/07/2012.  Findings: The cardiac silhouette, mediastinal and hilar contours are within normal limits and stable. The lungs are clear.  No pleural effusions.  The bony thorax is intact air  IMPRESSION: Normal chest x-ray.   Original Report Authenticated By: Rudie Meyer, M.D.    Ct Head Wo Contrast  06/21/2012  *RADIOLOGY REPORT*  Clinical Data: Altered mental status.  CT HEAD WITHOUT CONTRAST  Technique:  Contiguous axial images were obtained from the base of the skull through the vertex without contrast.  Comparison: None  Findings: Mild chronic small vessel white matter ischemic changes are noted.  No acute intracranial abnormalities are identified, including mass lesion or mass effect, hydrocephalus, extra-axial fluid collection, midline shift, hemorrhage, or acute infarction.  The visualized bony calvarium is unremarkable.  IMPRESSION: No evidence of acute intracranial abnormality.   Original Report Authenticated By: Harmon Pier, M.D.     Microbiology: Recent Results (from the past 240 hour(s))  MRSA PCR SCREENING     Status: None   Collection Time    06/21/12  8:44 PM      Result Value Range Status   MRSA by PCR NEGATIVE  NEGATIVE Final   Comment:            The GeneXpert MRSA Assay (FDA     approved for NASAL specimens     only), is one component of a     comprehensive MRSA colonization     surveillance program. It is not     intended to diagnose MRSA     infection nor to guide or     monitor treatment for     MRSA infections.     Labs: Basic  Metabolic Panel:  Recent Labs Lab 06/21/12 1650 06/21/12 1751 06/22/12 0436 06/23/12 0434  NA 132*  --  133* 138  K 2.5*  --  3.0* 3.2*  CL 80*  --  92* 103  CO2 22  --  23 26  GLUCOSE 134*  --  103* 125*  BUN 15  --  12 7  CREATININE 0.66  --  0.61 0.58  CALCIUM 10.0  --  8.6 8.4  MG  --  1.9  --   --    Liver Function Tests:  Recent Labs Lab 06/21/12 1715 06/22/12 0436 06/23/12 0434  AST 19 18 16   ALT 8 7 6   ALKPHOS 39 33* 34*  BILITOT 1.4* 1.0 0.6  PROT 8.2 6.8 6.0  ALBUMIN 4.1 3.5 3.0*  No results found for this basename: LIPASE, AMYLASE,  in the last 168 hours  Recent Labs Lab 06/21/12 1715  AMMONIA 25   CBC:  Recent Labs Lab 06/21/12 1650 06/22/12 0436 06/22/12 0729 06/23/12 0434  WBC 9.8 5.2  --  3.9*  NEUTROABS 7.5  --   --   --   HGB 12.3* 7.7* 9.8* 8.5*  HCT 34.0* 22.4* 27.4* 24.2*  MCV 81.3 83.0  --  82.3  PLT 397 224  --  232   Cardiac Enzymes:  Recent Labs Lab 06/21/12 1715 06/23/12 0434  CKTOTAL 297* 302*  CKMB  --  2.9  TROPONINI <0.30  --    BNP: BNP (last 3 results) No results found for this basename: PROBNP,  in the last 8760 hours CBG: No results found for this basename: GLUCAP,  in the last 168 hours     Signed:  Cassady Turano  [facility] 06/24/2012, 3:44 PM

## 2012-06-24 NOTE — Progress Notes (Signed)
INITIAL NUTRITION ASSESSMENT  DOCUMENTATION CODES Per approved criteria  -Non-severe (moderate) malnutrition in the context of chronic illness   INTERVENTION: Ensure Complete po BID, each supplement provides 350 kcal and 13 grams of protein.  NUTRITION DIAGNOSIS: Malnutrition related to moderate to severe depletion of muscle and fat as evidenced by observation of temporal, orbital and thoracic regions.   Goal: Pt to meet >/= 90% of their estimated nutrition needs.  Monitor:  Meals, supplement intake, labs and wt changes   Reason for Assessment: Malnutrition screen  52 y.o. male  Admitting Dx: Unresponsive episode  ASSESSMENT:  Pt had minimal response to questions related to nutrition hx. He is agreeable to take Ensure daily.  Concerned for his po intake because he had not eaten any of his breakfast this morning (refused). He's experienced wt loss according to our records and he tells me that he used to weight 180# but he is unable to provide details of when that was.   Pt hx reports no heat in his house, it's likely that he may not have adequate food in his house either.  He meets criteria for moderate malnutrition in the context of chronic illness given his moderate depletion of fat and muscle.  Nutrition Focused Physical Exam:  Subcutaneous Fat:  Orbital Region: severe malnutrition Upper Arm Region: moderate Thoracic and Lumbar Region: severe   Muscle:  Temple Region: moderate Clavicle Bone Region: moderate Clavicle and Acromion Bone Region: moderate Scapular Bone Region: n/a Dorsal Hand: moderate Patellar Region: n/a Anterior Thigh Region: n/a Posterior Calf Region: n/a  Edema: none noted    Height: Ht Readings from Last 1 Encounters:  06/21/12 5\' 10"  (1.778 m)    Weight: Wt Readings from Last 1 Encounters:  06/24/12 139 lb 1.8 oz (63.1 kg)    Ideal Body Weight: 166# (75 kg)  % Ideal Body Weight: 84%  Wt Readings from Last 10 Encounters:   06/24/12 139 lb 1.8 oz (63.1 kg)  04/10/12 136 lb (61.689 kg)  04/07/12 161 lb (73.029 kg)    Usual Body Weight: unknown  % Usual Body Weight: n/a  BMI:  Body mass index is 19.96 kg/(m^2). Within normal limits  Estimated Nutritional Needs: Kcal: 2130-8657   Protein: 70-80 gr Fluid: > 2200 liters per day  Skin: no issues noted  Diet Order: Dysphagia III with thin liquids (0-50 % recent meals)   EDUCATION NEEDS: -No education needs identified at this time   Intake/Output Summary (Last 24 hours) at 06/24/12 0929 Last data filed at 06/24/12 8469  Gross per 24 hour  Intake   4040 ml  Output   1650 ml  Net   2390 ml    Last BM:  06/22/12  Labs:   Recent Labs Lab 06/21/12 1650 06/21/12 1751 06/22/12 0436 06/23/12 0434  NA 132*  --  133* 138  K 2.5*  --  3.0* 3.2*  CL 80*  --  92* 103  CO2 22  --  23 26  BUN 15  --  12 7  CREATININE 0.66  --  0.61 0.58  CALCIUM 10.0  --  8.6 8.4  MG  --  1.9  --   --   GLUCOSE 134*  --  103* 125*    CBG (last 3)  No results found for this basename: GLUCAP,  in the last 72 hours  Scheduled Meds: . enoxaparin (LOVENOX) injection  40 mg Subcutaneous Q24H  . folic acid  1 mg Oral Daily  . multivitamins with  iron  1 tablet Oral Daily  . sodium chloride  3 mL Intravenous Q12H    Continuous Infusions:   Past Medical History  Diagnosis Date  . Mental disorder     Past Surgical History  Procedure Laterality Date  . Rt foot surgery  2002  . Rt kidney surgery      Royann Shivers MS,RD,LDN,CSG Office: [phone] Pager: [phone]
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
## 2012-06-24 NOTE — Plan of Care (Signed)
Marc Summers   [id number]   04-07-1961     This patient was presented for review for admission to [facility].  This chart and patient have been reviewed.  Currently the Tele-psych states that he does not have capacity to make medical decisions.  Careful review of the chart and labs document that this patient was severely hypokalemic, hyponatremic, dehydrated, malnourished and hypothermic. These symptoms support that he is delirious rather than psychotic. The patient's altered mental status will likely clear when these electrolyte imbalances are resolved.  At this time this patient is declined for admission to [facility] for the following reasons. -his current state of medical acuity. -his most recent admission to [facility].  This patient has an excellent plan of care (written by LCSW) that will meet the current needs of this patient which are many.    He will need assisted living care long term. He currently is his own guardian and has the right to refuse medication and treatment. His family or APS will need to pursue guardianship as suggested by LCSW to determine his competency. Competency is determined by a [name]. This is not done on an acute psychiatric in-patient admission.  Marc Summers should be re-evaluated to assess his need for acute psychiatric in-patient admission after his medical problems have been resolved.  Marc Summers. Marc Summers [facility] 06/24/2012 3:48 PM

## 2012-06-24 NOTE — Clinical Social Work Note (Signed)
Pt evaluated by telepsych yesterday and recommendation is for inpatient psych. Pt did not have capacity. CSW spoke with ACT team who assessed pt today and are working on placement.   Marc Summers, [place] [phone]

## 2012-06-24 NOTE — Progress Notes (Signed)
[facility] PROGRESS NOTE  [name] DOB: [birth year] DOA: 06/21/2012 PCP: No primary provider on file.  Assessment/Plan:  Unresponsiveness/acute encephalopathy. Etiology unclear at this time, but is likely associated with hypothermia and electrolyte abnormalities in the setting of underlying psychiatric disorder. It appears that his mental status has likely returned to his baseline. CT of his head is nonacute. His chest x-ray reveals no acute findings.   Delusional disorder. The patient was treated at behavioral health and December 2013 for a delusional disorder. Apparently he was not discharged on any psychiatric medications since he refused to take them.  Telepsychiatry consultation was obtained and recommended inpatient psychiatric care.  ACT team has been notified to arrange this.  Hypothermia. Likely secondary to environmental cold temperatures as the patient apparently has no heat or electricity in his home. Resolved status post warming device.   Hyponatremia. Likely secondary to hypovolemia. Resolved with saline infusion.  Hypokalemia. Corrected. Magnesium level is within normal limits.   Anemia. His hemoglobin fell from 12.3-8.5. The decrease in his hemoglobin is likely dilutional. No evidence of bleeding. Can be followed as an outpatient.  Code Status: Full code Family Communication: tried calling daughter Marc Summers but unable to reach her over the phone Disposition Plan: will need transfer to inpatient psychiatric facility. He is medically stable for discharge once bed is available.   Consultants:  Telepsychiatry  ACT team  Procedures:  none  Antibiotics:  none  HPI/Subjective: Patient awake, more interactive today.  Denies any pain or shortness of breath  Objective: Filed Vitals:   06/24/12 0700 06/24/12 0701 06/24/12 0942 06/24/12 1004  BP: 111/80 120/85  116/80  Pulse: 92 89 122 75  Temp:    98.6 F (37 C)  TempSrc:    Oral  Resp:     20  Height:      Weight:      SpO2: 100% 100% 97% 100%    Intake/Output Summary (Last 24 hours) at 06/24/12 1052 Last data filed at 06/24/12 8119  Gross per 24 hour  Intake   3800 ml  Output    850 ml  Net   2950 ml   Filed Weights   06/21/12 2252 06/22/12 0500 06/24/12 0628  Weight: 60.2 kg (132 lb 11.5 oz) 60.2 kg (132 lb 11.5 oz) 63.1 kg (139 lb 1.8 oz)    Exam:   General:  No distress, awake  Chest: CTA B  Cardiac: S1, S2, RRR, no pedal edema  Abdomen: soft, nt, nd, bs+  Data Reviewed: Basic Metabolic Panel:  Recent Labs Lab 06/21/12 1650 06/21/12 1751 06/22/12 0436 06/23/12 0434  NA 132*  --  133* 138  K 2.5*  --  3.0* 3.2*  CL 80*  --  92* 103  CO2 22  --  23 26  GLUCOSE 134*  --  103* 125*  BUN 15  --  12 7  CREATININE 0.66  --  0.61 0.58  CALCIUM 10.0  --  8.6 8.4  MG  --  1.9  --   --    Liver Function Tests:  Recent Labs Lab 06/21/12 1715 06/22/12 0436 06/23/12 0434  AST 19 18 16   ALT 8 7 6   ALKPHOS 39 33* 34*  BILITOT 1.4* 1.0 0.6  PROT 8.2 6.8 6.0  ALBUMIN 4.1 3.5 3.0*   No results found for this basename: LIPASE, AMYLASE,  in the last 168 hours  Recent Labs Lab 06/21/12 1715  AMMONIA 25   CBC:  Recent Labs  Lab 06/21/12 1650 06/22/12 0436 06/22/12 0729 06/23/12 0434  WBC 9.8 5.2  --  3.9*  NEUTROABS 7.5  --   --   --   HGB 12.3* 7.7* 9.8* 8.5*  HCT 34.0* 22.4* 27.4* 24.2*  MCV 81.3 83.0  --  82.3  PLT 397 224  --  232   Cardiac Enzymes:  Recent Labs Lab 06/21/12 1715 06/23/12 0434  CKTOTAL 297* 302*  CKMB  --  2.9  TROPONINI <0.30  --    BNP (last 3 results) No results found for this basename: PROBNP,  in the last 8760 hours CBG: No results found for this basename: GLUCAP,  in the last 168 hours  Recent Results (from the past 240 hour(s))  MRSA PCR SCREENING     Status: None   Collection Time    06/21/12  8:44 PM      Result Value Range Status   MRSA by PCR NEGATIVE  NEGATIVE Final   Comment:             The GeneXpert MRSA Assay (FDA     approved for NASAL specimens     only), is one component of a     comprehensive MRSA colonization     surveillance program. It is not     intended to diagnose MRSA     infection nor to guide or     monitor treatment for     MRSA infections.     Studies: No results found.  Scheduled Meds: . enoxaparin (LOVENOX) injection  40 mg Subcutaneous Q24H  . feeding supplement  237 mL Oral BID BM  . folic acid  1 mg Oral Daily  . multivitamins with iron  1 tablet Oral Daily  . sodium chloride  3 mL Intravenous Q12H   Continuous Infusions:   Principal Problem:   Unresponsive episode Active Problems:   Delusional disorder   Unspecified episodic mood disorder   Hypokalemia   Hypothermia   Anemia   Acute encephalopathy    Time spent:    Marc Summers  [facility] Pager [phone]. If 8PM-8AM, please contact night-coverage at [email or web address], password [facility] 06/24/2012, 10:52 AM  LOS: 3 days

## 2012-06-24 NOTE — BHH Counselor (Signed)
The patient has been accepted to [facility], to the service of Dr Forrestine Him. He was made IVC after discussion with ddr Memon. IVC paper work completed . Patient transferre to [facility].

## 2012-06-25 NOTE — Clinical Social Work Note (Signed)
Per hospital record, patient discharged to [facility] w assistance of [facility] ACT Team.    Santa Genera, LCSW Clinical Social Worker [phone])

## 2013-12-27 IMAGING — CT CT HEAD W/O CM
1 series · 16 of 30 positions shown, 20 images · non-contrast
Comparison: None

CLINICAL DATA: Altered mental status.

CT HEAD WITHOUT CONTRAST
TECHNIQUE: Contiguous axial images were obtained from the base of
the skull through the vertex without contrast.

[Series 2: headseq 4.8 h37s · axial · 0.50mm/px · z∈[+98,+252]mm · 16 of 36 slices shown, 20 images]
[im 2/36  brain]
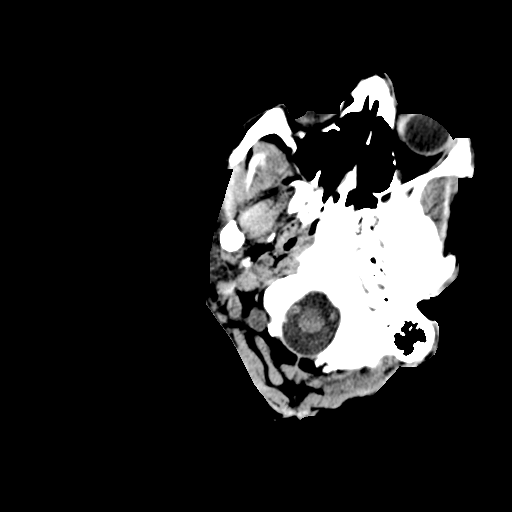
[im 2/36  bone]
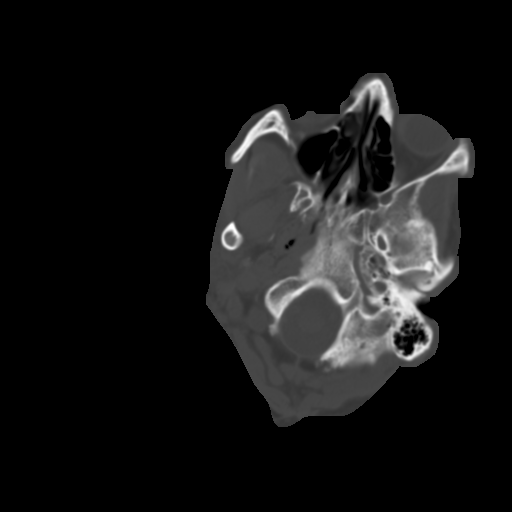
[im 4/36  brain]
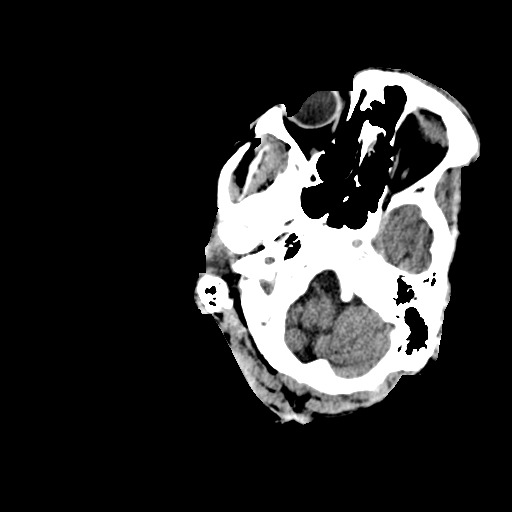
[im 7/36  brain]
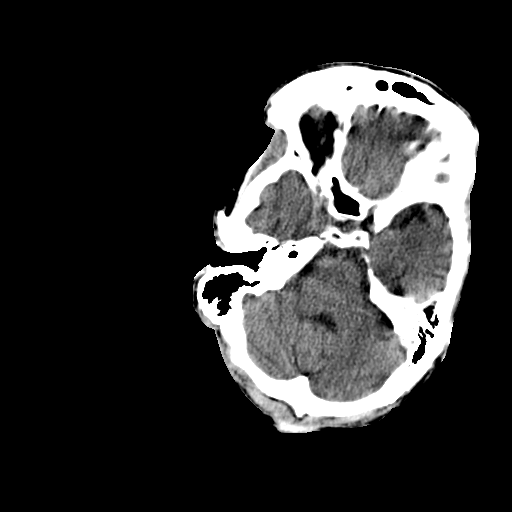
[im 9/36  brain]
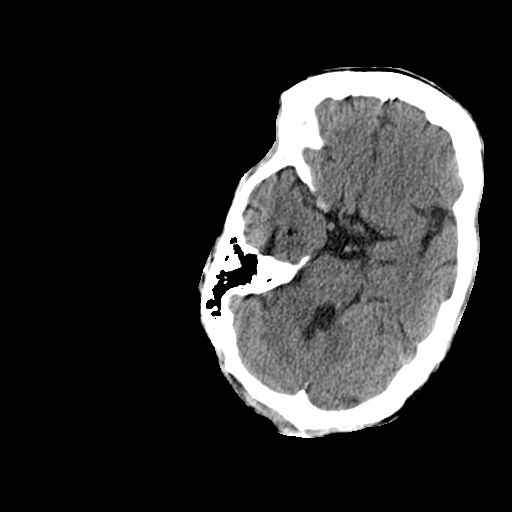
[im 10/36  brain]
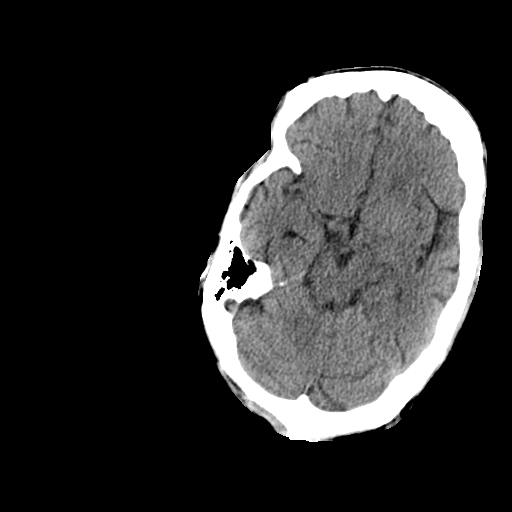
[im 10/36  bone]
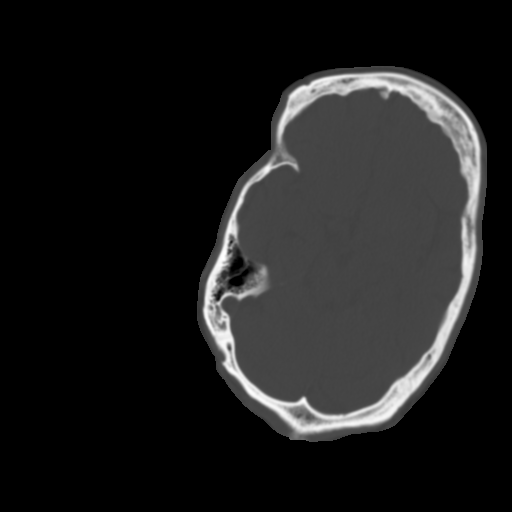
[im 13/36  brain]
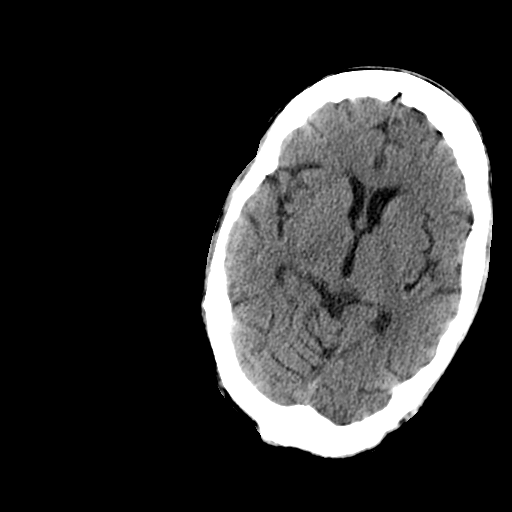
[im 15/36  brain]
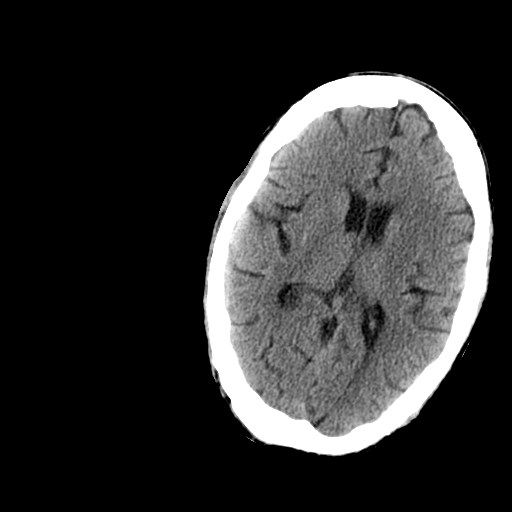
[im 17/36  brain]
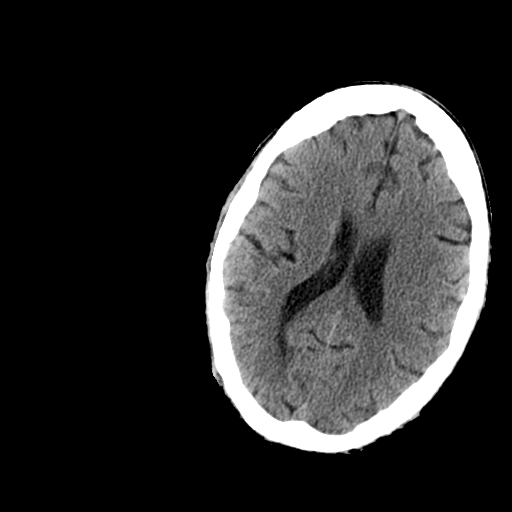
[im 19/36  brain]
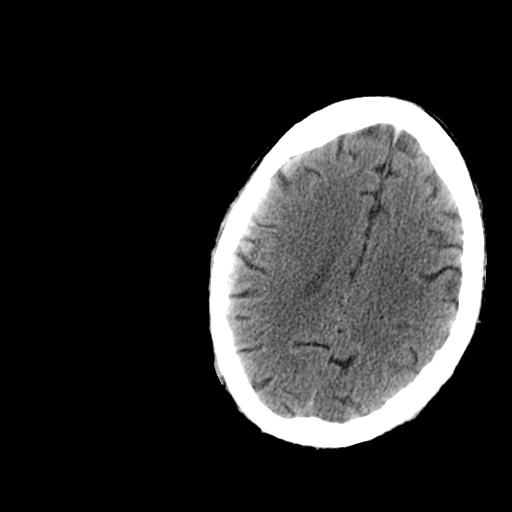
[im 19/36  bone]
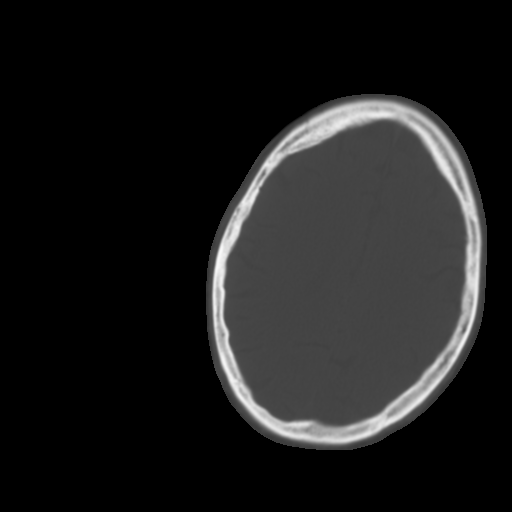
[im 21/36  brain]
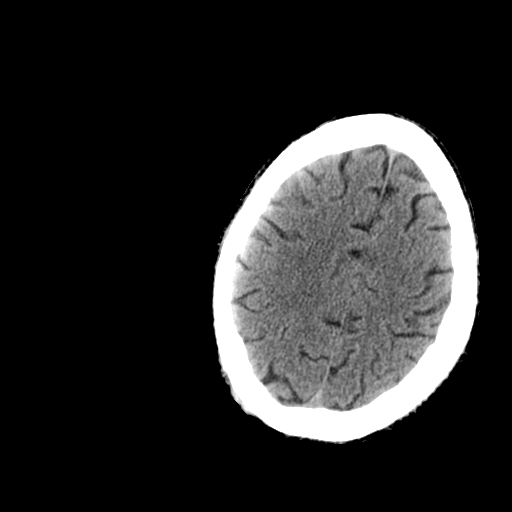
[im 23/36  brain]
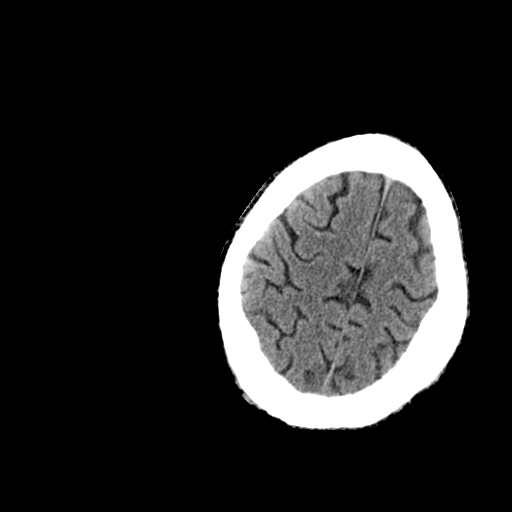
[im 26/36  brain]
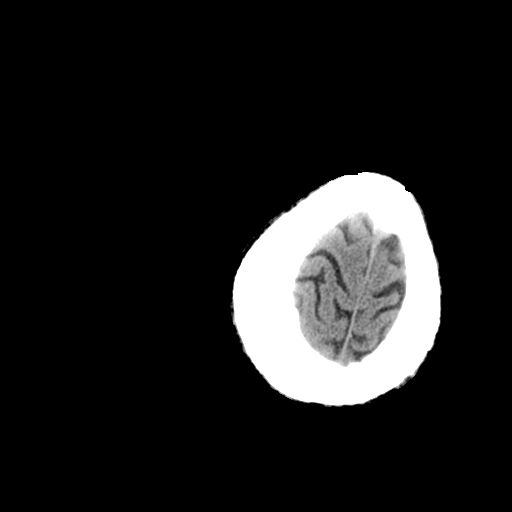
[im 27/36  brain]
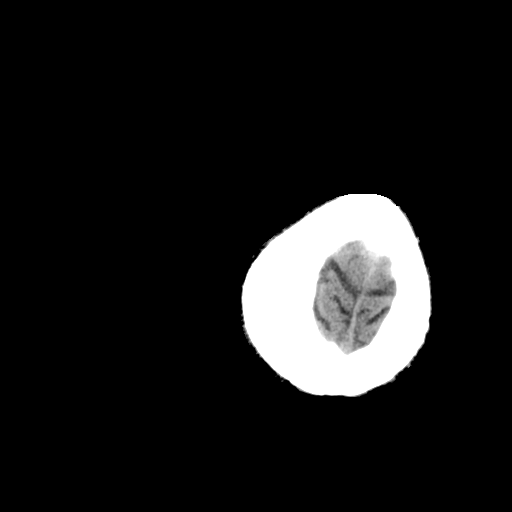
[im 27/36  bone]
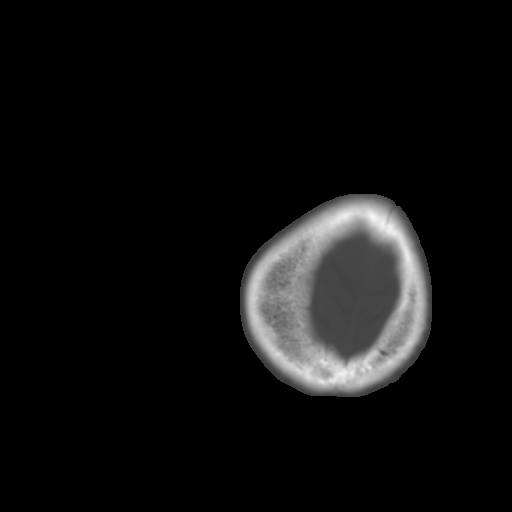
[im 29/36  brain]
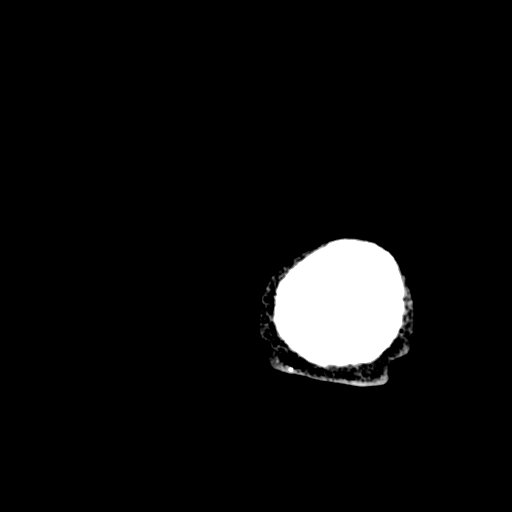
[im 32/36  brain]
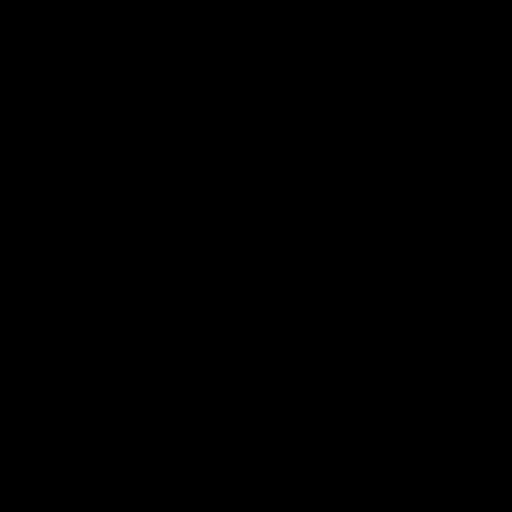
[im 34/36  brain]
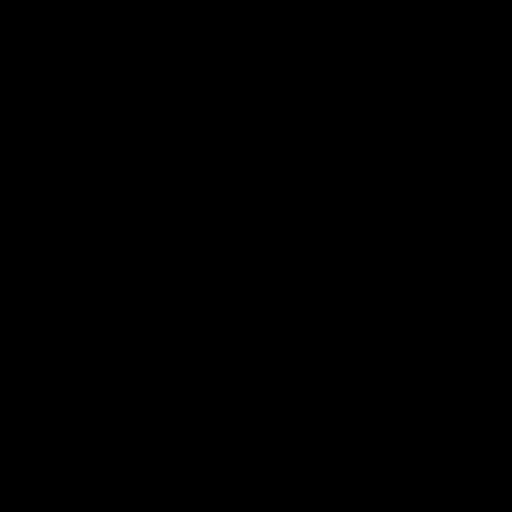

[16 of 30 positions shown; findings below may reference images not displayed]

FINDINGS: Mild chronic small vessel white matter ischemic changes
are noted.

No acute intracranial abnormalities are identified, including mass
lesion or mass effect, hydrocephalus, extra-axial fluid collection,
midline shift, hemorrhage, or acute infarction.

The visualized bony calvarium is unremarkable.
IMPRESSION: No evidence of acute intracranial abnormality.

## 2016-10-24 ENCOUNTER — Emergency Department (HOSPITAL_COMMUNITY)
Admission: EM | Admit: 2016-10-24 | Discharge: 2016-10-25 | Disposition: A | Discharge status: Other Acute Inpatient Hospital | Payer: Self-pay | Attending: Emergency Medicine | Admitting: Emergency Medicine

## 2016-10-24 ENCOUNTER — Encounter (HOSPITAL_COMMUNITY): Payer: Self-pay

## 2016-10-24 DIAGNOSIS — Z5181 Encounter for therapeutic drug level monitoring: Secondary | ICD-10-CM | POA: Insufficient documentation

## 2016-10-24 DIAGNOSIS — Z87891 Personal history of nicotine dependence: Secondary | ICD-10-CM | POA: Insufficient documentation

## 2016-10-24 DIAGNOSIS — F333 Major depressive disorder, recurrent, severe with psychotic symptoms: Secondary | ICD-10-CM | POA: Insufficient documentation
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
## 2016-10-25 ENCOUNTER — Other Ambulatory Visit: Payer: Self-pay | Admitting: Family

## 2016-10-25 ENCOUNTER — Inpatient Hospital Stay (HOSPITAL_COMMUNITY)
Admission: AD | Admit: 2016-10-25 | Discharge: 2016-11-05 | DRG: 885 | Disposition: A | Discharge status: Home / Self Care | Payer: No Typology Code available for payment source | Attending: Psychiatry | Admitting: Psychiatry

## 2016-10-25 DIAGNOSIS — F200 Paranoid schizophrenia: Secondary | ICD-10-CM | POA: Diagnosis present

## 2016-10-25 DIAGNOSIS — Z818 Family history of other mental and behavioral disorders: Secondary | ICD-10-CM | POA: Diagnosis not present

## 2016-10-25 DIAGNOSIS — F1010 Alcohol abuse, uncomplicated: Secondary | ICD-10-CM | POA: Diagnosis present

## 2016-10-25 DIAGNOSIS — F1910 Other psychoactive substance abuse, uncomplicated: Secondary | ICD-10-CM | POA: Diagnosis present

## 2016-10-25 DIAGNOSIS — Z9119 Patient's noncompliance with other medical treatment and regimen: Secondary | ICD-10-CM | POA: Diagnosis not present

## 2016-10-25 DIAGNOSIS — Z87891 Personal history of nicotine dependence: Secondary | ICD-10-CM

## 2016-10-25 DIAGNOSIS — G4700 Insomnia, unspecified: Secondary | ICD-10-CM | POA: Diagnosis present

## 2016-10-25 DIAGNOSIS — F339 Major depressive disorder, recurrent, unspecified: Principal | ICD-10-CM | POA: Diagnosis present

## 2016-10-25 DIAGNOSIS — Z79899 Other long term (current) drug therapy: Secondary | ICD-10-CM | POA: Diagnosis not present

## 2016-10-25 LAB — CBC WITH DIFFERENTIAL/PLATELET
BASOS ABS: 0 10*3/uL (ref 0.0–0.1)
Basophils Relative: 0 %
EOS ABS: 0.2 10*3/uL (ref 0.0–0.7)
EOS PCT: 3 %
HCT: 40.5 % (ref 39.0–52.0)
Hemoglobin: 14.1 g/dL (ref 13.0–17.0)
LYMPHS PCT: 34 %
Lymphs Abs: 2.8 10*3/uL (ref 0.7–4.0)
MCH: 29.7 pg (ref 26.0–34.0)
MCHC: 34.8 g/dL (ref 30.0–36.0)
MCV: 85.4 fL (ref 78.0–100.0)
Monocytes Absolute: 0.6 10*3/uL (ref 0.1–1.0)
Monocytes Relative: 7 %
Neutro Abs: 4.6 10*3/uL (ref 1.7–7.7)
Neutrophils Relative %: 56 %
PLATELETS: 244 10*3/uL (ref 150–400)
RBC: 4.74 MIL/uL (ref 4.22–5.81)
RDW: 13.6 % (ref 11.5–15.5)
WBC: 8.1 10*3/uL (ref 4.0–10.5)

## 2016-10-25 LAB — COMPREHENSIVE METABOLIC PANEL
ALT: 19 U/L (ref 17–63)
AST: 23 U/L (ref 15–41)
Albumin: 3.9 g/dL (ref 3.5–5.0)
Alkaline Phosphatase: 65 U/L (ref 38–126)
Anion gap: 9 (ref 5–15)
BUN: 12 mg/dL (ref 6–20)
CHLORIDE: 104 mmol/L (ref 101–111)
CO2: 25 mmol/L (ref 22–32)
CREATININE: 0.92 mg/dL (ref 0.61–1.24)
Calcium: 9.1 mg/dL (ref 8.9–10.3)
GFR calc Af Amer: >60 mL/min (ref >60)
GFR calc non Af Amer: >60 mL/min (ref >60)
Glucose, Bld: 101 mg/dL — ABNORMAL HIGH (ref 65–99)
Potassium: 3.9 mmol/L (ref 3.5–5.1)
SODIUM: 138 mmol/L (ref 135–145)
Total Bilirubin: 0.8 mg/dL (ref 0.3–1.2)
Total Protein: 7.5 g/dL (ref 6.5–8.1)

## 2016-10-25 LAB — URINALYSIS, ROUTINE W REFLEX MICROSCOPIC
BILIRUBIN URINE: NEGATIVE
Glucose, UA: NEGATIVE mg/dL
Hgb urine dipstick: NEGATIVE
KETONES UR: NEGATIVE mg/dL
Leukocytes, UA: NEGATIVE
NITRITE: NEGATIVE
PH: 7 (ref 5.0–8.0)
Protein, ur: NEGATIVE mg/dL
Specific Gravity, Urine: 1.02 (ref 1.005–1.030)

## 2016-10-25 LAB — RAPID URINE DRUG SCREEN, HOSP PERFORMED
Amphetamines: NOT DETECTED
BARBITURATES: NOT DETECTED
Benzodiazepines: NOT DETECTED
Cocaine: NOT DETECTED
Opiates: NOT DETECTED
TETRAHYDROCANNABINOL: NOT DETECTED

## 2016-10-25 LAB — SALICYLATE LEVEL

## 2016-10-25 LAB — ETHANOL: Alcohol, Ethyl (B): <5 mg/dL (ref <5)

## 2016-10-25 LAB — ACETAMINOPHEN LEVEL

## 2016-10-25 MED ORDER — IBUPROFEN 400 MG PO TABS: 600.0000 mg | ORAL_TABLET | Freq: Three times a day (TID) | ORAL | Status: DC | PRN

## 2016-10-25 MED ORDER — ACETAMINOPHEN 325 MG PO TABS: 650.0000 mg | ORAL_TABLET | Freq: Four times a day (QID) | ORAL | Status: DC | PRN

## 2016-10-25 MED ORDER — ONDANSETRON HCL 4 MG PO TABS: 4.0000 mg | ORAL_TABLET | Freq: Three times a day (TID) | ORAL | Status: DC | PRN

## 2016-10-25 MED ORDER — MAGNESIUM HYDROXIDE 400 MG/5ML PO SUSP: 30.0000 mL | Freq: Every day | ORAL | Status: DC | PRN

## 2016-10-25 MED ORDER — ALUM & MAG HYDROXIDE-SIMETH 200-200-20 MG/5ML PO SUSP: 30.0000 mL | ORAL | Status: DC | PRN

## 2016-10-25 MED ORDER — TRAZODONE HCL 50 MG PO TABS: 50.0000 mg | ORAL_TABLET | Freq: Every evening | ORAL | Status: DC | PRN

## 2016-10-25 NOTE — BHH Counselor (Signed)
Per Nira ConnJason Berry, NP: AM psychiatric evaluation recommended.   Elmore GuiseJaniah Maijor Hornig, LPC-A & LCAS-A Therapeutic Triage Specialist [phone]

## 2016-10-25 NOTE — ED Notes (Signed)
Pt given drink and snack.
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
## 2016-10-25 NOTE — Progress Notes (Addendum)
CSW received IVC paperwork from [facility] and it was noted to be incorrect in that the Examination and Recommendation document was not completed until after the 24 hour time limit (before or after) on the original Findings and Custody order is issued.  CSW requested that the [facility] EDP re-initiate the IVC process.  Marc Summers, Marc Summers, Marc Summers Clinical Social Work Disposition [phone]  CSW called [facility] and related that [facility] and could be transferred once new IVC was completed.  Hillery Jacksanika Lewis, NP is the accepting provider.  Dr. Elna BreslowEappen is the attending provider.  Call report to [phone].

## 2016-10-25 NOTE — ED Notes (Signed)
Money in amount of 40.86$ was documented by Peyton NajjarLarry with security and placed in locked security safe.
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
## 2016-10-25 NOTE — ED Notes (Signed)
Patient lying in bed sleeping at this time. Equal rise and fall of chest noted. Sitter at bedside.

## 2016-10-25 NOTE — ED Provider Notes (Signed)
[facility] Provider Note   CSN: [id number] Arrival date & time: 10/24/16  2309     History   Chief Complaint Chief Complaint  Patient presents with  . V70.1    HPI Marc Summers is a 55 y.o. male.  Patient brought in by police after IVC paperwork completed by patient's brother. Patient does not know why he is here. He is alert and oriented 3. He denies any suicidal or homicidal thoughts. Denies any illicit drug or alcohol use. He denies any pain. He denies any chronic medical conditions and denies taking any medications. States he has not had anything to drink in 11 years.  "Per IVC paperwork, the patient has a list of people around him in the past. He has recently lost his homes and his vehicles. He has plans to burn one of his residence that he previously owned. He has had mental illness hospitalizations in the past and a history of alcohol and drug abuse. Family is concerned that he'll harm himself or someone else."   The history is provided by the patient and the police.    Past Medical History:  Diagnosis Date  . Mental disorder     Patient Active Problem List   Diagnosis Date Noted  . Anemia 06/22/2012  . Unresponsive episode 06/22/2012  . Acute encephalopathy 06/22/2012  . Hypokalemia 06/21/2012  . Hypothermia 06/21/2012  . Delusional disorder(297.1) 04/10/2012  . Unspecified episodic mood disorder 04/10/2012    Past Surgical History:  Procedure Laterality Date  . rt foot surgery  2002  . rt kidney surgery         Home Medications    Prior to Admission medications   Not on File    Family History No family history on file.  Social History Social History  Substance Use Topics  . Smoking status: Former Games developer  . Smokeless tobacco: Never Used  . Alcohol use No     Allergies   Patient has no known allergies.   Review of Systems Review of Systems  Constitutional: Negative for activity change, appetite change, fatigue and fever.    HENT: Negative for congestion.   Respiratory: Negative for cough, chest tightness and shortness of breath.   Cardiovascular: Negative for chest pain.  Gastrointestinal: Negative for abdominal pain, nausea and vomiting.  Genitourinary: Negative for dysuria, hematuria, scrotal swelling and testicular pain.  Musculoskeletal: Negative for arthralgias and myalgias.  Neurological: Negative for dizziness, weakness, light-headedness and headaches.    all other systems are negative except as noted in the HPI and PMH.    Physical Exam Updated Vital Signs BP 107/71 (BP Location: Right Arm)   Pulse (!) 58   Temp 98.4 F (36.9 C) (Oral)   Resp 18   Ht 5' 11.5" (1.816 m)   Wt 83.9 kg (185 lb)   SpO2 98%   BMI 25.44 kg/m   Physical Exam  Constitutional: He is oriented to person, place, and time. He appears well-developed and well-nourished. No distress.  Disheveled.  Calm and cooperative  HENT:  Head: Normocephalic and atraumatic.  Mouth/Throat: Oropharynx is clear and moist. No oropharyngeal exudate.  Eyes: Conjunctivae and EOM are normal. Pupils are equal, round, and reactive to light.  Neck: Normal range of motion. Neck supple.  No meningismus.  Cardiovascular: Normal rate, regular rhythm, normal heart sounds and intact distal pulses.   No murmur heard. Pulmonary/Chest: Effort normal and breath sounds normal. No respiratory distress.  Abdominal: Soft. There is no tenderness. There is no  rebound and no guarding.  Musculoskeletal: Normal range of motion. He exhibits no edema or tenderness.  Neurological: He is alert and oriented to person, place, and time. No cranial nerve deficit. He exhibits normal muscle tone. Coordination normal.  No ataxia on finger to nose bilaterally. No pronator drift. 5/5 strength throughout. CN 2-12 intact.Equal grip strength. Sensation intact.   Skin: Skin is warm. Capillary refill takes less than 2 seconds.  Psychiatric: He has a normal mood and affect. His  behavior is normal.  Nursing note and vitals reviewed.    ED Treatments / Results  Labs (all labs ordered are listed, but only abnormal results are displayed) Labs Reviewed  COMPREHENSIVE METABOLIC PANEL - Abnormal; Notable for the following:       Result Value   Glucose, Bld 101 (*)    All other components within normal limits  ACETAMINOPHEN LEVEL - Abnormal; Notable for the following:    Acetaminophen (Tylenol), Serum <10 (*)    All other components within normal limits  CBC WITH DIFFERENTIAL/PLATELET  ETHANOL  SALICYLATE LEVEL  RAPID URINE DRUG SCREEN, HOSP PERFORMED  URINALYSIS, ROUTINE W REFLEX MICROSCOPIC    EKG  EKG Interpretation None       Radiology No results found.  Procedures Procedures (including critical care time)  Medications Ordered in ED Medications - No data to display   Initial Impression / Assessment and Plan / ED Course  I have reviewed the triage vital signs and the nursing notes.  Pertinent labs & imaging results that were available during my care of the patient were reviewed by me and considered in my medical decision making (see chart for details).    Patient with IVC paperwork completed by patient's brother. Family feels patient is a threat to others and himself.  Patient denies any suicidal ideation or homicidal ideation. Denies hallucinations. Denies alcohol or drug use.  Screening labs obtained. Patient medically clear for psychiatric evaluation. TTS consult completed and they will reassess him in the morning. Final Clinical Impressions(s) / ED Diagnoses   Final diagnoses:  None    New Prescriptions New Prescriptions   No medications on file     Glynn Octaveancour, [place], MD 10/25/16 [phone]

## 2016-10-25 NOTE — Plan of Care (Signed)
Problem: Safety: Goal: Periods of time without injury will increase Outcome: Progressing Patient is on q15 minute safety checks and low fall risk precautions. Patient contracts for safety with staff.   

## 2016-10-25 NOTE — Progress Notes (Signed)
Nursing Progress Note 1900-0730  D) Patient presents with flat affect and is anxious. Patient adjusting to the unit in no acute distress. Patient denies needs at this time. Patient denies SI/HI/AVH or pain. Patient contracts for safety on the unit.   A) Emotional support given. 1:1 interaction and active listening provided. No medications ordered/requested this shift. Snacks and fluids provided. Opportunities for questions or concerns presented to patient. Patient encouraged to continue to work on treatment goals. Labs, vital signs and patient behavior monitored throughout shift. Patient safety maintained with q15 min safety checks. Low fall risk precautions in place and reviewed with patient; patient verbalized understanding.  R) Patient remains safe on the unit at this time. Patient is resting in bed without complaints. Will continue to monitor.

## 2016-10-26 ENCOUNTER — Encounter (HOSPITAL_COMMUNITY): Payer: Self-pay

## 2016-10-26 DIAGNOSIS — F339 Major depressive disorder, recurrent, unspecified: Principal | ICD-10-CM

## 2016-10-26 DIAGNOSIS — F0630 Mood disorder due to known physiological condition, unspecified: Secondary | ICD-10-CM

## 2016-10-26 MED ORDER — FLUOXETINE HCL 10 MG PO CAPS
10.0000 mg | ORAL_CAPSULE | Freq: Every day | ORAL | Status: DC
  Filled 2016-10-26 (×11): qty 1

## 2016-10-26 NOTE — H&P (Signed)
Psychiatric Admission Assessment Adult  Patient Identification: Marc Summers MRN:  [id number] Date of Evaluation:  10/26/2016 Chief Complaint:  Patient states "I have no idea why I am here."  Principal Diagnosis: MDD (major depressive disorder), recurrent episode ([place]) Diagnosis:   Patient Active Problem List   Diagnosis Date Noted  . MDD (major depressive disorder), recurrent episode ([place]) [F33.9] 10/25/2016  . Anemia [D64.9] 06/22/2012  . Unresponsive episode [R41.89] 06/22/2012  . Acute encephalopathy [G93.40] 06/22/2012  . Hypokalemia [E87.6] 06/21/2012  . Hypothermia [T68.XXXA] 06/21/2012  . Delusional disorder(297.1) [F22] 04/10/2012  . Unspecified episodic mood disorder [F39] 04/10/2012   History of Present Illness:   Per initial [facility] Assessment note 10/25/2016:   Marc Summers is an 56 y.o.singele male, brought into [facility], by law enforcement, after IVC paperwork was completed by a family member, [name] on 10/25/2014 with the [facility]   Patient was responsive, however responded with "no" to most of the questions.  Patient appeared to be irritable and avoided eye contact and crossed his arms when communicating with Probation officer.  Patient denies, SI/HI/AVH or access to weapons.  Per IVC 10/24/2016 [facility]): Patient's IVC paperwork was completed by a family member.  Patient's IVC paperwork stated Patient lost his job, resulting in the lost of 3 of his homes and all of his vechicles.  Patient states that he has plans to burn down a home that he previously owned.   Patient has written out a list of people that he feels have done him wrong in the past.  Patient has also written notes about murder, rape, amputations, and terrorism.  Patient has a history of inpatient treatment for alcohol and drug use.  Patient has been hospitalized "several years ago" at [facility] due to mental illness.  Patient has a history of alcohol and drug abuse.       Per Medical records, Patient received inpatient treatment at [facility] on 07/2008 and 03/2012 for delusional disorder.  Patient was seen at [facility], 01/2011 for Homicidal Ideations.  Patient reports currently not receiving services from any provider.   Per psychiatric assessment on the morning of 10/26/2016:  Patient agreed to speak with this Provider but would not provide any details of his past history. Patient denied ever having been admitted to a psychiatric hospital. He would reply to questions "Ask they people who put me here all these questions. Because I have no idea. I was minding my own business when the police picked me up." The patient denies ever having taken any psychiatric medications previously. The patient is observed sitting in the dayroom calmly watching TV. His mood appears dysphoric during interaction with Providers.    Associated Signs/Symptoms: Depression Symptoms:  depressed mood, anhedonia, psychomotor retardation, (Hypo) Manic Symptoms:  Irritable Mood, Anxiety Symptoms:  Denies Psychotic Symptoms:  Denies PTSD Symptoms: Negative Total Time spent with patient: 30 minutes  Past Psychiatric History: Was diagnosed with Delusional Disorder during a previous admission to [facility]  Is the patient at risk to self? Yes.    Has the patient been a risk to self in the past 6 months? Yes.    Has the patient been a risk to self within the distant past? Yes.    Is the patient a risk to others? Yes.    Has the patient been a risk to others in the past 6 months? Yes.    Has the patient been a risk to others within the distant past? Yes.  Prior Inpatient Therapy:  Yes  Prior Outpatient Therapy:  Denies  Alcohol Screening: 1. How often do you have a drink containing alcohol?: Never 9. Have you or someone else been injured as a result of your drinking?: No 10. Has a relative or friend or a doctor or another health worker been concerned about your drinking or suggested you cut  down?: No Alcohol Use Disorder Identification Test Final Score (AUDIT): 0 Brief Intervention: AUDIT score less than 7 or less-screening does not suggest unhealthy drinking-brief intervention not indicated Substance Abuse History in the last 12 months:  No. Consequences of Substance Abuse: Negative Previous Psychotropic Medications: Yes  Psychological Evaluations: No  Past Medical History:  Past Medical History:  Diagnosis Date  . Mental disorder     Past Surgical History:  Procedure Laterality Date  . rt foot surgery  2002  . rt kidney surgery     Family History: History reviewed. No pertinent family history. Family Psychiatric  History: Reports mother had schizophrenia  Tobacco Screening: Have you used any form of tobacco in the last 30 days? (Cigarettes, Smokeless Tobacco, Cigars, and/or Pipes): No Social History:  History  Alcohol Use No     History  Drug Use  . Types: Marijuana    Comment: in high school    Additional Social History:      Pain Medications: Patient denies Prescriptions: Patient denies Over the Counter: Patient denies History of alcohol / drug use?: Yes Longest period of sobriety (when/how long): Since 2007 Name of Substance 1: Alcohol 1 - Age of First Use: Unknown 1 - Amount (size/oz): Unknow 1 - Frequency: Unknown 1 - Duration: Unknown 1 - Last Use / Amount: Unknown                  Allergies:  No Known Allergies Lab Results:  Results for orders placed or performed during the hospital encounter of 10/24/16 (from the past 48 hour(s))  CBC with Differential     Status: None   Collection Time: 10/25/16 12:04 AM  Result Value Ref Range   WBC 8.1 4.0 - 10.5 K/uL   RBC 4.74 4.22 - 5.81 MIL/uL   Hemoglobin 14.1 13.0 - 17.0 g/dL   HCT 40.5 39.0 - 52.0 %   MCV 85.4 78.0 - 100.0 fL   MCH 29.7 26.0 - 34.0 pg   MCHC 34.8 30.0 - 36.0 g/dL   RDW 13.6 11.5 - 15.5 %   Platelets 244 150 - 400 K/uL   Neutrophils Relative % 56 %   Neutro Abs 4.6  1.7 - 7.7 K/uL   Lymphocytes Relative 34 %   Lymphs Abs 2.8 0.7 - 4.0 K/uL   Monocytes Relative 7 %   Monocytes Absolute 0.6 0.1 - 1.0 K/uL   Eosinophils Relative 3 %   Eosinophils Absolute 0.2 0.0 - 0.7 K/uL   Basophils Relative 0 %   Basophils Absolute 0.0 0.0 - 0.1 K/uL  Comprehensive metabolic panel     Status: Abnormal   Collection Time: 10/25/16 12:04 AM  Result Value Ref Range   Sodium 138 135 - 145 mmol/L   Potassium 3.9 3.5 - 5.1 mmol/L   Chloride 104 101 - 111 mmol/L   CO2 25 22 - 32 mmol/L   Glucose, Bld 101 (H) 65 - 99 mg/dL   BUN 12 6 - 20 mg/dL   Creatinine, Ser 0.92 0.61 - 1.24 mg/dL   Calcium 9.1 8.9 - 10.3 mg/dL   Total Protein 7.5 6.5 -  8.1 g/dL   Albumin 3.9 3.5 - 5.0 g/dL   AST 23 15 - 41 U/L   ALT 19 17 - 63 U/L   Alkaline Phosphatase 65 38 - 126 U/L   Total Bilirubin 0.8 0.3 - 1.2 mg/dL   GFR calc non Af Amer >60 >60 mL/min   GFR calc Af Amer >60 >60 mL/min    Comment: (NOTE) The eGFR has been calculated using the CKD EPI equation. This calculation has not been validated in all clinical situations. eGFR's persistently <60 mL/min signify possible Chronic Kidney Disease.    Anion gap 9 5 - 15  Ethanol     Status: None   Collection Time: 10/25/16 12:04 AM  Result Value Ref Range   Alcohol, Ethyl (B) <5 <5 mg/dL    Comment:        LOWEST DETECTABLE LIMIT FOR SERUM ALCOHOL IS 5 mg/dL FOR MEDICAL PURPOSES ONLY   Acetaminophen level     Status: Abnormal   Collection Time: 10/25/16 12:04 AM  Result Value Ref Range   Acetaminophen (Tylenol), Serum <10 (L) 10 - 30 ug/mL    Comment:        THERAPEUTIC CONCENTRATIONS VARY SIGNIFICANTLY. A RANGE OF 10-30 ug/mL MAY BE AN EFFECTIVE CONCENTRATION FOR MANY PATIENTS. HOWEVER, SOME ARE BEST TREATED AT CONCENTRATIONS OUTSIDE THIS RANGE. ACETAMINOPHEN CONCENTRATIONS >150 ug/mL AT 4 HOURS AFTER INGESTION AND >50 ug/mL AT 12 HOURS AFTER INGESTION ARE OFTEN ASSOCIATED WITH TOXIC REACTIONS.   Salicylate  level     Status: None   Collection Time: 10/25/16 12:04 AM  Result Value Ref Range   Salicylate Lvl <1.5 2.8 - 30.0 mg/dL  Rapid urine drug screen (hospital performed)     Status: None   Collection Time: 10/25/16  9:00 AM  Result Value Ref Range   Opiates NONE DETECTED NONE DETECTED   Cocaine NONE DETECTED NONE DETECTED   Benzodiazepines NONE DETECTED NONE DETECTED   Amphetamines NONE DETECTED NONE DETECTED   Tetrahydrocannabinol NONE DETECTED NONE DETECTED   Barbiturates NONE DETECTED NONE DETECTED    Comment:        DRUG SCREEN FOR MEDICAL PURPOSES ONLY.  IF CONFIRMATION IS NEEDED FOR ANY PURPOSE, NOTIFY LAB WITHIN 5 DAYS.        LOWEST DETECTABLE LIMITS FOR URINE DRUG SCREEN Drug Class       Cutoff (ng/mL) Amphetamine      1000 Barbiturate      200 Benzodiazepine   056 Tricyclics       979 Opiates          300 Cocaine          300 THC              50   Urinalysis, Routine w reflex microscopic     Status: None   Collection Time: 10/25/16  9:00 AM  Result Value Ref Range   Color, Urine YELLOW YELLOW   APPearance CLEAR CLEAR   Specific Gravity, Urine 1.020 1.005 - 1.030   pH 7.0 5.0 - 8.0   Glucose, UA NEGATIVE NEGATIVE mg/dL   Hgb urine dipstick NEGATIVE NEGATIVE   Bilirubin Urine NEGATIVE NEGATIVE   Ketones, ur NEGATIVE NEGATIVE mg/dL   Protein, ur NEGATIVE NEGATIVE mg/dL   Nitrite NEGATIVE NEGATIVE   Leukocytes, UA NEGATIVE NEGATIVE    Blood Alcohol level:  Lab Results  Component Value Date   [facility] <5 10/25/2016   ETH <11 48/04/6551    Metabolic Disorder Labs:  No results found  for: HGBA1C, MPG No results found for: PROLACTIN No results found for: CHOL, TRIG, HDL, CHOLHDL, VLDL, LDLCALC  Current Medications: Current Facility-Administered Medications  Medication Dose Route Frequency Provider Last Rate Last Dose  . acetaminophen (TYLENOL) tablet 650 mg  650 mg Oral Q6H PRN Derrill Center, NP      . alum & mag hydroxide-simeth (MAALOX/MYLANTA) 200-200-20  MG/5ML suspension 30 mL  30 mL Oral Q4H PRN Derrill Center, NP      . magnesium hydroxide (MILK OF MAGNESIA) suspension 30 mL  30 mL Oral Daily PRN Derrill Center, NP      . traZODone (DESYREL) tablet 50 mg  50 mg Oral QHS PRN Derrill Center, NP       PTA Medications: No prescriptions prior to admission.    Musculoskeletal: Strength & Muscle Tone: within normal limits Gait & Station: normal Patient leans: N/A  Psychiatric Specialty Exam: Physical Exam  Review of Systems  Psychiatric/Behavioral: Positive for depression.    Blood pressure 103/76, pulse 63, temperature 98.4 F (36.9 C), temperature source Oral, resp. rate 20, height 5' 9" (1.753 m), weight 95.7 kg (211 lb).Body mass index is 31.16 kg/m.  General Appearance: Casual  Eye Contact:  Fair  Speech:  Normal Rate  Volume:  Normal  Mood:  Dysphoric  Affect:  Labile  Thought Process:  Goal Directed  Orientation:  Full (Time, Place, and Person)  Thought Content:  Rumination  Suicidal Thoughts:  No  Homicidal Thoughts:  No  Memory:  Immediate;   Fair Recent;   Fair Remote;   Fair  Judgement:  Poor  Insight:  Shallow  Psychomotor Activity:  Normal  Concentration:  Concentration: Fair and Attention Span: Fair  Recall:  Poor  Fund of Knowledge:  Fair  Language:  Fair  Akathisia:  Negative  Handed:  Right  AIMS (if indicated):     Assets:  Leisure Time Resilience  ADL's:  Intact  Cognition:  WNL  Sleep:  Number of Hours: 6.5    Treatment Plan Summary: Daily contact with patient to assess and evaluate symptoms and progress in treatment and Medication management  Observation Level/Precautions:  15 minute checks  Laboratory:  CBC Chemistry Profile UDS  Psychotherapy:  Individual and Group   Medications:  Start Prozac 10 mg po daily for depressive symptoms and titrate based on patient response  Consultations:  As needed  Discharge Concerns:  Safety and Stability   Estimated LOS: 5-7 days  Other:      Physician Treatment Plan for Primary Diagnosis: MDD (major depressive disorder), recurrent episode ([place]) Long Term Goal(s): Improvement in symptoms so as ready for discharge  Short Term Goals: Ability to identify changes in lifestyle to reduce recurrence of condition will improve, Ability to demonstrate self-control will improve, Ability to identify and develop effective coping behaviors will improve, Ability to maintain clinical measurements within normal limits will improve and Compliance with prescribed medications will improve  Physician Treatment Plan for Secondary Diagnosis: Principal Problem:   MDD (major depressive disorder), recurrent episode ([place])  Long Term Goal(s): Improvement in symptoms so as ready for discharge  Short Term Goals: Ability to identify changes in lifestyle to reduce recurrence of condition will improve  I certify that inpatient services furnished can reasonably be expected to improve the patient's condition.    Elmarie Shiley, NP 6/30/20181:51 PM  I have examined the patient and agreed with the findings of H&P and treatment plan. I have also done suicide assessment on this  patient

## 2016-10-26 NOTE — Progress Notes (Signed)
Dorinda HillDonald is a 56 year old male being admitted involuntarily to [facility] from [facility].  He came to ED under IVC initiated by a family member.  IVC stated that he had lost his job, houses and cars.  He reported plan to burn down a home that he owned and had a list of people that had done him wrong in the past.  Family was concerned about his talking about murder, rape, amputations and terrorism.  Dor[facility] HillDonald [facility] very [facility].  He denied any problems and stated that he was made to come here and isn't sure why.  He denied SI/HI or A/V hallucinations.  He denied any medical problems.  He would answer basic questions with yes/no but would not elaborate.  RN went over admission paper work and he refused to sign anything at this time.  He was very guarded when he was oriented to the unit.  Belongings searched and secured in locker # 29.  Skin assessment completed and noted surgery scar on right flank (kidney surgery in 10th grade), right ankle scar and dry skin on bottom of right foot.  Q 15 minute checks initiated for safety.  We will monitor the progress towards his goals.

## 2016-10-26 NOTE — [facility] Suicide Risk Assessment (Signed)
[facility] INPATIENT:  Family/Significant Other Suicide Prevention Education  Suicide Prevention Education:  Patient did not have suicidal ideation at the time of admission.  Family contact with uncle who acts as his "guardian of sorts" was made with his consent, and is documented in a CSW note.  Other numbers are given in that note to make future contact with brothers with whom he lives and/or daughter who initiated IVC  Marc Summers 10/26/2016, 4:13 PM

## 2016-10-26 NOTE — Tx Team (Signed)
Initial Treatment Plan 10/26/2016 12:26 AM Marc Summers [place] [id number]    PATIENT STRESSORS: Financial difficulties Loss of Job   PATIENT STRENGTHS: Capable of independent living Communication skills General fund of knowledge   PATIENT IDENTIFIED PROBLEMS: Depression  Paranoia  "Summers didn't want to come here, they told me Summers had to be here so Summers don't know what you can help me with"                 DISCHARGE CRITERIA:  Improved stabilization in mood, thinking, and/or behavior Motivation to continue treatment in a less acute level of care Verbal commitment to aftercare and medication compliance  PRELIMINARY DISCHARGE PLAN: Outpatient therapy Medication management  PATIENT/FAMILY INVOLVEMENT: This treatment plan has been presented to and reviewed with the patient, Marc Summers [place].  The patient and family have been given the opportunity to ask questions and make suggestions.  Levin BaconHeather V Lovena Kluck, RN 10/26/2016, 12:26 AM

## 2016-10-26 NOTE — [facility] Suicide Risk Assessment (Signed)
[facility]   Nursing information obtained from:  Patient Demographic factors:  Male, Unemployed Current Mental Status:  NA Loss Factors:  Financial problems / change in socioeconomic status Historical Factors:  NA Risk Reduction Factors:  NA  Total Time spent with patient: 1.5 hours Principal Problem: <principal problem not specified> Diagnosis:   Patient Active Problem List   Diagnosis Date Noted  . MDD (major depressive disorder), recurrent episode (HCC) [F33.9] 10/25/2016  . Anemia [D64.9] 06/22/2012  . Unresponsive episode [R41.89] 06/22/2012  . Acute encephalopathy [G93.40] 06/22/2012  . Hypokalemia [E87.6] 06/21/2012  . Hypothermia [T68.XXXA] 06/21/2012  . Delusional disorder(297.1) [F22] 04/10/2012  . Unspecified episodic mood disorder [F39] 04/10/2012   Subjective Data: Alert, oriented, responds mostly by yes or no. Mood dysphoric  Continued Clinical Symptoms:  Alcohol Use Disorder Identification Test Final Score (AUDIT): 0 The "Alcohol Use Disorders Identification Test", Guidelines for Use in Primary Care, Second Edition.  World Science writerHealth Organization [facility](WHO). Score between 0-7:  no or low risk or alcohol related problems. Score between 8-15:  moderate risk of alcohol related problems. Score between 16-19:  high risk of alcohol related problems. Score 20 or above:  warrants further diagnostic evaluation for alcohol dependence and treatment.   CLINICAL FACTORS:   Depression:   Anhedonia Hopelessness Impulsivity Alcohol/Substance Abuse/Dependencies More than one psychiatric diagnosis Previous Psychiatric Diagnoses and Treatments   Musculoskeletal: Strength & Muscle Tone: within normal limits Gait & Station: normal Patient leans: no lean  Psychiatric Specialty Exam: Physical Exam  Constitutional: He appears well-developed.  HENT:  Head: Normocephalic.    Review of Systems  Cardiovascular: Negative for chest pain.  Skin: Negative for  rash.  Psychiatric/Behavioral: Positive for depression. The patient is nervous/anxious.     Blood pressure 103/76, pulse 63, temperature 98.4 F (36.9 C), temperature source Oral, resp. rate 20, height 5\' 9"  (1.753 m), weight 95.7 kg (211 lb).Body mass index is 31.16 kg/m.  General Appearance: Casual  Eye Contact:  Fair  Speech:  Normal Rate  Volume:  Normal  Mood:  Dysphoric  Affect:  Labile  Thought Process:  Goal Directed  Orientation:  Full (Time, Place, and Person)  Thought Content:  Rumination  Suicidal Thoughts:  No. Has endorsed impulsivity at admission  Homicidal Thoughts:  No  Memory:  Immediate;   Fair Recent;   Fair  Judgement:  Poor  Insight:  Shallow  Psychomotor Activity:  Normal  Concentration:  Concentration: Fair and Attention Span: Fair  Recall:    Fund of Knowledge:  Fair  Language:  Fair  Akathisia:  Negative  Handed:  Right  AIMS (if indicated):     Assets:  Limited   ADL's:  Intact  Cognition:  WNL  Sleep:  Number of Hours: 6.5      COGNITIVE FEATURES THAT CONTRIBUTE TO RISK:  Closed-mindedness and Polarized thinking    SUICIDE RISK:   Moderate:  Frequent suicidal ideation with limited intensity, and duration, some specificity in terms of plans, no associated intent, good self-control, limited dysphoria/symptomatology, some risk factors present, and identifiable protective factors, including available and accessible social support.  PLAN OF CARE: Admit for stabilization and safety. Medication management and stability for depression  I certify that inpatient services furnished can reasonably be expected to improve the patient's condition.   Thresa RossAKHTAR, Cherrie Franca, MD 10/26/2016, 11:42 AM
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
## 2016-10-26 NOTE — Progress Notes (Signed)
D.  Pt pleasant on approach, denies complaints at this time.  Pt denied needs at present.  Pt attended evening wrap up group, see group notes.  Minimal interaction on unit.  Pt denies SI/HI/AVH at this time.  A.  Support and encouragement offered  R.  Pt remains safe on the unit, will continue to monitor.

## 2016-10-26 NOTE — Progress Notes (Signed)
[facility] Group Notes:  (Nursing/MHT/Case Management/Adjunct)  Date:  10/26/2016  Time:  9:05 PM  Type of Therapy:  Psychoeducational Skills  Participation Level:  Minimal  Participation Quality:  Resistant  Affect:  Flat  Cognitive:  Lacking  Insight:  Lacking  Engagement in Group:  Lacking  Modes of Intervention:  Education  Summary of Progress/Problems: Patient stated that he had a "great day" but did not go into further detail. As for the theme of the day, his coping skill will be to take things "one day at a time".   Hanin Decook S 10/26/2016, 9:05 PM

## 2016-10-26 NOTE — Plan of Care (Signed)
Problem: Health Behavior/Discharge Planning: Goal: Compliance with prescribed medication regimen will improve Outcome: Not Progressing Patient is not compliant with medication regimen.  Refused prescribed medication after encouragement and education.

## 2016-10-26 NOTE — [facility] Group Notes (Signed)
[facility] Group Notes:  (Clinical Social Work)  10/26/2016  11:15-12:00PM  Summary of Progress/Problems:   Today's process group involved patients discussing their feelings related to being hospitalized, as well as benefits they see to being in the hospital.  The patient expressed a primary feeling about being hospitalized is "not stressed about it because why I'm here will take care of itself."  He did not talk any other time, but did appear to be interested in what other patients said, was outwardly empathetic toward a patient who was crying a lot.  Type of Therapy:  Group Therapy - Process  Participation Level:  Active  Participation Quality:  Attentive  Affect:  Blunted  Cognitive:  Could not assess  Insight:  Improving  Engagement in Therapy:  Improving  Modes of Intervention:  Exploration, Discussion  Ambrose MantleMareida Grossman-Orr, LCSW 10/26/2016, 12:57 PM

## 2016-10-26 NOTE — Progress Notes (Signed)
DAR NOTE: Patient presents with flat affect and appears guarded and suspicious.  Denies pain, auditory and visual hallucinations.  Refused to participate during assessment.  Refused medication when offered.  Patient states "I have never taken medication in my life and I'm not about to start."  Charisse MarchLaura D., NP made aware of patient refusing medication.  Patient offered support and encouragement as needed.  Maintained on routine safety checks.  .  Attended group and participated.  Patient was visible in the dayroom watching TV.  Minimal interaction with staff and peers.  Patient is safe on the unit. Offered no complaint.

## 2016-10-27 DIAGNOSIS — Z87891 Personal history of nicotine dependence: Secondary | ICD-10-CM

## 2016-10-27 DIAGNOSIS — F1290 Cannabis use, unspecified, uncomplicated: Secondary | ICD-10-CM

## 2016-10-27 MED ORDER — ARIPIPRAZOLE 5 MG PO TABS
5.0000 mg | ORAL_TABLET | Freq: Every day | ORAL | Status: DC
  Filled 2016-10-27 (×10): qty 1

## 2016-10-27 NOTE — Plan of Care (Signed)
Problem: Education: Goal: Mental status will improve Outcome: Not Progressing Pt denies any psychiatric problems, despite hx.  Problem: Health Behavior/Discharge Planning: Goal: Compliance with prescribed medication regimen will improve Outcome: Not Progressing Pt is not taking any medications.

## 2016-10-27 NOTE — Progress Notes (Signed)
D: Pt has declined all offers of scheduled medication. He asked what Prozac is for and, when told said, "I'm not depressed." He denied any SI or other psychiatric symptoms. He was irritable when asked about this but is otherwise polite. He said he does not need to be here. Asked why he is here, he says it is because papers were taken out on him. He refused to fill out the daily self-inventory form. He did attend group music therapy this morning.  A: Both medications - Prozac and Abilify - were refused. Q15 safety checks maintained. Support/encouragement offered.  R: Pt remains free from harm. Will continue to monitor for needs/safety.

## 2016-10-27 NOTE — [facility] Group Notes (Signed)
[facility] Group Notes:  (Clinical Social Work)  10/27/2016  11:00AM-12:00PM  Summary of Progress/Problems:  The main focus of today's process group was to listen to a variety of genres of music and to identify that different types of music provoke different responses.  The patient then was able to identify personally what was soothing for them, as well as energizing, as well as how patient can personally use this knowledge in sleep habits, with depression, and with other symptoms.  The patient expressed at the beginning of group the overall feeling of "great" and was noncommital with much of the music, although he appeared to enjoy it.  Type of Therapy:  Music Therapy   Participation Level:  Active  Participation Quality:  Attentive   Affect:  Blunted  Cognitive:  Paranoid  Insight:  Limited  Engagement in Therapy:  Engaged  Modes of Intervention:   Activity, Exploration  Ambrose MantleMareida Grossman-Orr, LCSW 10/27/2016

## 2016-10-27 NOTE — Progress Notes (Signed)
D.  Pt pleasant on approach, denies complaints at this time.  Pt was positive for evening wrap up group, observed engaged in minimal but appropriate interaction with peers on the unit.  Pt denies SI/HI/AVH at this time.  A.  Support and encouragement offered  R.  Pt remains safe on the unit, will continue to monitor.
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
## 2016-10-28 DIAGNOSIS — F200 Paranoid schizophrenia: Secondary | ICD-10-CM | POA: Diagnosis present

## 2016-10-28 DIAGNOSIS — Z818 Family history of other mental and behavioral disorders: Secondary | ICD-10-CM

## 2016-10-28 NOTE — Progress Notes (Addendum)
D:  Patient denied SI and HI, contracts for safety.  Denied A/V hallucinations.  Denied pain. A:  Patient refused all medications today, stated he never takes any medicines.  Refused ability and prozac this morning. Emotional support and encouragement given patient. R:  Patient's goal is to discharge as soon as possible.  Safety maintained with 15 minute checks.  Patient stated he sleeps good, no medicine.  Good appetite, normal energy level, good concentration.  Denied depression, anxiety, hopeless.  Denied withdrawals.  Denied SI.  Denied physical problems.  Denied physical pain.  Goal is to discharge soon.

## 2016-10-28 NOTE — Progress Notes (Signed)
Adult Psychoeducational Group Note  Date:  10/28/2016 Time:  1:25 AM  Group Topic/Focus:  Wrap-Up Group:   The focus of this group is to help patients review their daily goal of treatment and discuss progress on daily workbooks.  Participation Level:  Minimal  Participation Quality:  Appropriate  Affect:  Appropriate  Cognitive:  Appropriate  Insight: Limited  Engagement in Group:  Limited  Modes of Intervention:  Discussion  Additional Comments:  Pt stated his goal for today was to interact more with peers. Pt stated he felt he accomplished his goal today. Pt rated his day a 7. Pt stated his goal for tomorrow was to attended all groups.  Felipa FurnaceChristopher  Ayanna Gheen 10/28/2016, 1:25 AM

## 2016-10-28 NOTE — Tx Team (Signed)
Interdisciplinary Treatment and Diagnostic Plan Update  10/28/2016 Time of Session: 10:56 AM  DORN HARTSHORNE MRN: [id number]  Principal Diagnosis: MDD (major depressive disorder), recurrent episode ([place])  Secondary Diagnoses: Principal Problem:   MDD (major depressive disorder), recurrent episode ([place])   Current Medications:  Current Facility-Administered Medications  Medication Dose Route Frequency Provider Last Rate Last Dose  . acetaminophen (TYLENOL) tablet 650 mg  650 mg Oral Q6H PRN Derrill Center, NP      . alum & mag hydroxide-simeth (MAALOX/MYLANTA) 200-200-20 MG/5ML suspension 30 mL  30 mL Oral Q4H PRN Derrill Center, NP      . ARIPiprazole (ABILIFY) tablet 5 mg  5 mg Oral Daily Merian Capron, MD      . FLUoxetine (PROZAC) capsule 10 mg  10 mg Oral Daily Elmarie Shiley A, NP      . magnesium hydroxide (MILK OF MAGNESIA) suspension 30 mL  30 mL Oral Daily PRN Derrill Center, NP      . traZODone (DESYREL) tablet 50 mg  50 mg Oral QHS PRN Derrill Center, NP        PTA Medications: No prescriptions prior to admission.    Patient Stressors: Financial difficulties Loss of Job  Patient Strengths: Capable of independent living Curator fund of knowledge  Treatment Modalities: Medication Management, Group therapy, Case management,  1 to 1 session with clinician, Psychoeducation, Recreational therapy.   Physician Treatment Plan for Primary Diagnosis: MDD (major depressive disorder), recurrent episode ([place]) Long Term Goal(s): Improvement in symptoms so as ready for discharge  Short Term Goals: Ability to identify changes in lifestyle to reduce recurrence of condition will improve Ability to demonstrate self-control will improve Ability to identify and develop effective coping behaviors will improve Ability to maintain clinical measurements within normal limits will improve Compliance with prescribed medications will improve Ability to identify  changes in lifestyle to reduce recurrence of condition will improve  Medication Management: Evaluate patient's response, side effects, and tolerance of medication regimen.  Therapeutic Interventions: 1 to 1 sessions, Unit Group sessions and Medication administration.  Evaluation of Outcomes: Not Progressing  Physician Treatment Plan for Secondary Diagnosis: Principal Problem:   MDD (major depressive disorder), recurrent episode ([place])   Long Term Goal(s): Improvement in symptoms so as ready for discharge  Short Term Goals: Ability to identify changes in lifestyle to reduce recurrence of condition will improve Ability to demonstrate self-control will improve Ability to identify and develop effective coping behaviors will improve Ability to maintain clinical measurements within normal limits will improve Compliance with prescribed medications will improve Ability to identify changes in lifestyle to reduce recurrence of condition will improve  Medication Management: Evaluate patient's response, side effects, and tolerance of medication regimen.  Therapeutic Interventions: 1 to 1 sessions, Unit Group sessions and Medication administration.  Evaluation of Outcomes: Not Progressing   RN Treatment Plan for Primary Diagnosis: MDD (major depressive disorder), recurrent episode ([place]) Long Term Goal(s): Knowledge of disease and therapeutic regimen to maintain health will improve  Short Term Goals: Ability to participate in decision making will improve and Compliance with prescribed medications will improve  Medication Management: RN will administer medications as ordered by provider, will assess and evaluate patient's response and provide education to patient for prescribed medication. RN will report any adverse and/or side effects to prescribing provider.  Therapeutic Interventions: 1 on 1 counseling sessions, Psychoeducation, Medication administration, Evaluate responses to treatment,  Monitor vital signs and CBGs as ordered, Perform/monitor CIWA, COWS,  AIMS and Fall Risk screenings as ordered, Perform wound care treatments as ordered.  Evaluation of Outcomes: Progressing    Recreational Therapy Treatment Plan for Primary Diagnosis: Schizophrenia ([place]) Long Term Goal(s): Patient will participate in recreation therapy treatment in at least 2 group sessions without prompting from LRT  Short Term Goals: Patient will demonstrate increased ability to follow instructions, as demonstrated by ability to follow LRT instructions on first prompt during recreation therapy group sessions  Treatment Modalities: Group and Pet Therapy  Therapeutic Interventions: Psychoeducation  Evaluation of Outcomes: Progressing   LCSW Treatment Plan for Primary Diagnosis: MDD (major depressive disorder), recurrent episode ([place]) Long Term Goal(s): Safe transition to appropriate next level of care at discharge, Engage patient in therapeutic group addressing interpersonal concerns.  Short Term Goals: Engage patient in aftercare planning with referrals and resources  Therapeutic Interventions: Assess for all discharge needs, 1 to 1 time with Social worker, Explore available resources and support systems, Assess for adequacy in community support network, Educate family and significant other(s) on suicide prevention, Complete Psychosocial Assessment, Interpersonal group therapy.  Evaluation of Outcomes: Not Met Disposition plan unknown, CSW to talk to daughter re: plan as her message states he cannot return to brother's home   Progress in Treatment: Attending groups: Yes Participating in groups: Yes Taking medication as prescribed: Yes Toleration medication: Yes, no side effects reported at this time Family/Significant other contact made:  Patient understands diagnosis: Yes AEB Discussing patient identified problems/goals with staff: Yes Medical problems stabilized or resolved: Yes Denies  suicidal/homicidal ideation: Yes Issues/concerns per patient self-inventory: None Other: N/A  New problem(s) identified: Pt refused to sign treatment team note  New Short Term/Long Term Goal(s):"I don't need to be here. My goal is to get out of here.  I am ready to go. G.O."  Discharge Plan or Barriers:   Reason for Continuation of Hospitalization:  Homicidal ideation Medication stabilization   Estimated Length of Stay: 5 days  Attendees: Patient: Sion Reinders to sign 10/28/2016  10:56 AM  Physician: Ursula Alert, MD 10/28/2016  10:56 AM  Nursing: Grayland Ormond, RN 10/28/2016  10:56 AM  RN Care Manager: Lars Pinks, RN 10/28/2016  10:56 AM  Social Worker: Ripley Fraise 10/28/2016  10:56 AM  Recreational Therapist: Victorino Sparrow, LRT/CTRS 10/28/2016  10:56 AM  Other: Norberto Sorenson 10/28/2016  10:56 AM  Other:  10/28/2016  10:56 AM    Scribe for Treatment Team:  Roque Lias LCSW 10/28/2016 10:56 AM

## 2016-10-28 NOTE — Progress Notes (Signed)
Recreation Therapy Notes  INPATIENT RECREATION THERAPY ASSESSMENT  Patient Details Name: Geraldine SolarDonald I Messamore MRN: [id number] DOB: May 30, 1960 Today's Date: 10/28/2016  Patient Stressors:  (Pt identified no stressors.)  Pt stated he was here because someone signed papers.  Coping Skills:   Avoidance, Exercise, Talking, Music, Sports  Personal Challenges:  (Pt identified no personal challenges.)  Leisure Interests (2+):  Music - Listen, Harold BarbanNature - Lawn care, Individual - Other (Comment), Social - Friends, Sports - Teaching laboratory technicianDance  Awareness of Community Resources:  Yes  Community Resources:  Library, Coffee Shop, Public affairs consultantestaurants, Engineer, petroleumGym  Current Use: Yes  Patient Strengths:  Confidence, truthful  Patient Identified Areas of Improvement:  "Nothing"  Current Recreation Participation:  Everyday  Patient Goal for Hospitalization:  "No goals"  [place]  [place]  Current [place] (including self-harm):  No  Current HI:  No  Consent to Intern Participation: Yes   Caroll RancherMarjette Wilma Wuthrich, LRT/CTRS  Lillia AbedLindsay, Leyna Vanderkolk A 10/28/2016, 2:21 PM

## 2016-10-28 NOTE — [facility] Group Notes (Signed)
[facility] LCSW Group Therapy  10/28/2016 1:15 pm  Type of Therapy: Process Group Therapy  Participation Level:  Active  Participation Quality:  Appropriate  Affect:  Flat  Cognitive:  Oriented  Insight:  Improving  Engagement in Group:  Limited  Engagement in Therapy:  Limited  Modes of Intervention:  Activity, Clarification, Education, Problem-solving and Support  Summary of Progress/Problems: Today's group addressed the issue of overcoming obstacles.  Patients were asked to identify their biggest obstacle post d/c that stands in the way of their on-going success, and then problem solve as to how to manage this. Stayed the entire time, engaged throughout.  Minimal interaction.  "I am fine. I don't have any obstacles.  I just stay positive."  Marc Summers, Marc Summers 10/28/2016   3:54 PM

## 2016-10-28 NOTE — Progress Notes (Signed)
Nursing Progress Note: 7p-7a D: Pt currently presents with a pleasant/bright affect and behavior. Pt states "I feel fantastic today." Interacting appropriately with milieu. Pt reports good sleep with current medication regimen.   A: Pt provided with medications per providers orders. [name] labs and vitals were monitored throughout the night. Pt supported emotionally and encouraged to express concerns and questions. Pt educated on medications.  R: [name] safety ensured with 15 minute and environmental checks. Pt currently denies SI/HI/Self Harm and AVH. Pt verbally contracts to seek staff if SI/HI or A/VH occurs and to consult with staff before acting on any harmful thoughts. Will continue to monitor.

## 2016-10-28 NOTE — Plan of Care (Signed)
Problem: Education: Goal: Emotional status will improve Outcome: Progressing Nurse discussed anxiety/depression/coping skills with patient.    

## 2016-10-28 NOTE — Progress Notes (Signed)
Patient's daughter called to speak to nurse.  Patient did not approve anyone for release of information.  Patient informed of daughter's call and patient will call his daughter.

## 2016-10-28 NOTE — Progress Notes (Signed)
Recreation Therapy Notes  Date: 10/28/16 Time: 1000 Location: [address] Dayroom  Group Topic: Coping Skills  Goal Area(s) Addresses:  Patient will be able to identify coping skills. Patient will be able to identify the benefits of positive coping skills. Patient will be able to identify benefits of using coping skills post d/c.  Behavioral Response: Minimal  Intervention: OrthoptistWeb design, pencils  Activity: OrthoptistWeb Design.  Patients were given a blank spider web worksheet.  Patients were to identify the situations that had gotten them "stuck" and brought them to the hospital.  Patients were to then identify 3 coping skills for each issue they identified that had gotten them "stuck".  Education: PharmacologistCoping Skills, Building control surveyorDischarge Planning.   Education Outcome: Acknowledges understanding/In group clarification offered/Needs additional education.   Clinical Observations/Feedback: Pt sat and observed.  Pt was pleasant and respectful to peers.  Pt stated he was here because of a trial but would not elaborate.   Caroll RancherMarjette Shawnte Winton, LRT/CTRS         Caroll RancherLindsay, Kyshon Tolliver A 10/28/2016 12:54 PM

## 2016-10-28 NOTE — Progress Notes (Signed)
[facility] MD Progress Note  10/28/2016 2:44 PM Marc Summers  MRN:  [id number]   Subjective: Patient states "I am fine. I did not do any of that.'  Objective:Patient seen and chart reviewed.Discussed patient with treatment team.  Pt today seen as calm, responds to questions are limited to monosyllables . Pt however does not seem to be responding to internal stimuli . He is calm in milieu. He reports he did not say or do anything stated in the IVC petition.  Hence offered to contact daughter - Charlyne Quale - with patient . However she did not respond.  She however called back Clinical research associate later on , patient was not with Clinical research associate at that time. Per her she has noticed delusional behavior , he has a notebook where he has written down statements that are aggressive , delusional and hence she was worried about him. He believes there is some sort of trial and also believes there is a conspiracy going on. She is concerned he is not on his medications. She will bring the notebook to [employer] . She also wanted to know about disability application, discussed to contact CSW.      Principal Problem: Schizophrenia (HCC) ( per hx ) Diagnosis:   Patient Active Problem List   Diagnosis Date Noted  . Schizophrenia (HCC) [F20.9] 10/28/2016  . Anemia [D64.9] 06/22/2012  . Unresponsive episode [R41.89] 06/22/2012  . Acute encephalopathy [G93.40] 06/22/2012  . Hypokalemia [E87.6] 06/21/2012  . Hypothermia [T68.XXXA] 06/21/2012  . Delusional disorder(297.1) [F22] 04/10/2012  . Unspecified episodic mood disorder [F39] 04/10/2012   Total Time spent with patient: 30 MINUTES  Past Psychiatric History: hospital admission  Past Medical History:  Past Medical History:  Diagnosis Date  . Mental disorder     Past Surgical History:  Procedure Laterality Date  . rt foot surgery  2002  . rt kidney surgery     Family History: Please see H&P.  Family Psychiatric  History: mom: schizophrenia Social History:   History  Alcohol Use No     History  Drug Use  . Types: Marijuana    Comment: in high school    Social History   Social History  . Marital status: Married    Spouse name: N/A  . Number of children: N/A  . Years of education: N/A   Social History Main Topics  . Smoking status: Former Games developer  . Smokeless tobacco: Never Used  . Alcohol use No  . Drug use: Yes    Types: Marijuana     Comment: in high school  . Sexual activity: No   Other Topics Concern  . None   Social History Narrative  . None   Additional Social History:    Pain Medications: Patient denies Prescriptions: Patient denies Over the Counter: Patient denies History of alcohol / drug use?: Yes Longest period of sobriety (when/how long): Since 2007 Name of Substance 1: Alcohol 1 - Age of First Use: Unknown 1 - Amount (size/oz): Unknow 1 - Frequency: Unknown 1 - Duration: Unknown 1 - Last Use / Amount: Unknown                  Sleep: Fair  Appetite:  Fair  Current Medications: Current Facility-Administered Medications  Medication Dose Route Frequency Provider Last Rate Last Dose  . acetaminophen (TYLENOL) tablet 650 mg  650 mg Oral Q6H PRN Oneta Rack, NP      . alum & mag hydroxide-simeth (MAALOX/MYLANTA) 200-200-20 MG/5ML suspension 30 mL  30 mL Oral Q4H PRN Oneta RackLewis, Tanika N, NP      . ARIPiprazole (ABILIFY) tablet 5 mg  5 mg Oral Daily Thresa RossAkhtar, Nadeem, MD      . FLUoxetine (PROZAC) capsule 10 mg  10 mg Oral Daily Fransisca Kaufmannavis, Laura A, NP      . magnesium hydroxide (MILK OF MAGNESIA) suspension 30 mL  30 mL Oral Daily PRN Oneta RackLewis, Tanika N, NP      . traZODone (DESYREL) tablet 50 mg  50 mg Oral QHS PRN Oneta RackLewis, Tanika N, NP        Lab Results: No results found for this or any previous visit (from the past 48 hour(s)).  Blood Alcohol level:  Lab Results  Component Value Date   ETH <5 10/25/2016   ETH <11 06/21/2012    Metabolic Disorder Labs: No results found for: HGBA1C, MPG No results  found for: PROLACTIN No results found for: CHOL, TRIG, HDL, CHOLHDL, VLDL, LDLCALC  Physical Findings: AIMS: Facial and Oral Movements Muscles of Facial Expression: None, normal Lips and Perioral Area: None, normal Jaw: None, normal Tongue: None, normal,Extremity Movements Upper (arms, wrists, hands, fingers): None, normal Lower (legs, knees, ankles, toes): None, normal, Trunk Movements Neck, shoulders, hips: None, normal, Overall Severity Severity of abnormal movements (highest score from questions above): None, normal Incapacitation due to abnormal movements: None, normal Patient's awareness of abnormal movements (rate only patient's report): No Awareness, Dental Status Current problems with teeth and/or dentures?: No Does patient usually wear dentures?: No  CIWA:    COWS:     Musculoskeletal: Strength & Muscle Tone: within normal limits Gait & Station: normal Patient leans: N/A  Psychiatric Specialty Exam: Physical Exam  Nursing note and vitals reviewed.   Review of Systems  Psychiatric/Behavioral: The patient is nervous/anxious.   All other systems reviewed and are negative.   Blood pressure 107/77, pulse (!) 59, temperature 98.2 F (36.8 C), resp. rate 18, height 5\' 9"  (1.753 m), weight 95.7 kg (211 lb).Body mass index is 31.16 kg/m.  General Appearance: Casual  Eye Contact:  Fair  Speech:  Normal Rate  Volume:  Decreased  Mood:  Anxiety about being here  Affect:  Constricted  Thought Process: appears to be linear  Orientation:  Full (Time, Place, and Person)  Thought Content:  denied ay delusions , but appears guarded   Suicidal Thoughts:  No  Homicidal Thoughts:  No   Memory:  Immediate - fair, remote - fair, recent - fair  Judgement:  Poor  Insight:  Shallow  Psychomotor Activity:  Decreased  Concentration: fair  Recall:  fair  Fund of Knowledge:  Fair  Language:  Fair  Akathisia:  Negative  Handed:  Right  AIMS (if indicated):     Assets:  Social  Support  ADL's:  Intact  Cognition:  WNL  Sleep:  Number of Hours: 5   Schizophrenia (HCC) unstable  Will continue today 10/28/16  plan as below except where it is noted.    Treatment Plan Summary:Patient with delusions , paranoia as per IVC and collateral information obtained from ddaughter - will continue to observe. He continues to refuse medications . Daily contact with patient to assess and evaluate symptoms and progress in treatment, Medication management and Plan see below   Reviewed past medical records,treatment plan.  Pt declines medications, will offer PRN medications. Will continue to monitor vitals ,medication compliance and treatment side effects while patient is here.  Will monitor for medical issues as well as call  consult as needed.  CSW will continue to work on disposition.  Patient to participate in therapeutic milieu .       Kreston Ahrendt, MD 10/28/2016, 2:44 PM

## 2016-10-29 NOTE — Progress Notes (Signed)
Recreation Therapy Notes  Date: 10/29/2016 Time: 10:00am Location: [address] Dayroom  Group Topic: Self-Expression  Goal Area(s) Addresses:  Patient will identify things that define who they are.  Intervention: Art  Activity: Patients asked to identify things that define who they are by drawing pictures or writing words inside of a blank head outline. Once all patients completed their picture they were each asked to identify the top 5 things that defined them.  Education: Self-Expression, Building control surveyorDischarge Planning.   Education Outcome: Needs additional education  Clinical Observations/Feedback: Pt did not attend group.  Marvell Fullerachel Meyer, Recreation Therapy   Caroll RancherMarjette Doroteo Nickolson, LRT/CTRS

## 2016-10-29 NOTE — [facility] Group Notes (Signed)
[facility] LCSW Group Therapy 10/29/2016 11:00 AM  Type of Therapy: Group Therapy- Feelings about Diagnosis  Pt did not attend, declined invitation.   Vernie ShanksLauren Keysha Damewood, LCSW 10/29/2016 2:47 PM

## 2016-10-29 NOTE — Progress Notes (Signed)
Nursing Progress Note: 7p-7a D: Pt currently presents with pleasant/minimal affect and behavior. Pt states "I am tired but I am having the most wonderful day. I'm just too tired to talk." Interacting minimally with milieu. Pt reports good sleep during the previous night with current medication regimen.   A: Pt provided with medications per providers orders. [name] labs and vitals were monitored throughout the night. Pt supported emotionally and encouraged to express concerns and questions. Pt educated on medications.  R: [name] safety ensured with 15 minute and environmental checks. Pt currently denies SI, HI, and AVH. Pt verbally contracts to seek staff if SI,HI, or AVH occurs and to consult with staff before acting on any harmful thoughts. Will continue to monitor.

## 2016-10-29 NOTE — Progress Notes (Signed)
Recreation Therapy Notes  Animal-Assisted Activity (AAA) Program Checklist/Progress Notes Patient Eligibility Criteria Checklist & Daily Group note for Rec Tx Intervention  Date: 07.03.2018 Time: 3:00pm Location: [address]    AAA/T Program Assumption of Risk Form signed by Patient/ or Parent Legal Guardian No  Behavioral Response: Did not attend.    Clinical Observations/Feedback: Patient discussed with MD for appropriateness in pet therapy session. Both LRT and MD agree patient is appropriate for participation. Patient offered participation in session, however politely declined invitation. Due to patient declining offer to participate no consent form signed at this time. Patient pleasant on approach.   Marykay Lexenise L Diane Mochizuki, LRT/CTRS        Taliyah Watrous L 10/29/2016 4:05 PM

## 2016-10-29 NOTE — Plan of Care (Signed)
Problem: Education: Goal: Will be free of psychotic symptoms Outcome: Progressing Nurse discussed auditory/visual hallucinations with patient.  Discussed coping skills with patient.

## 2016-10-29 NOTE — Progress Notes (Signed)
[facility] MD Progress Note  10/29/2016 2:33 PM Marc Summers  MRN:  [id number]   Subjective: Patient states " I am ok. I did not write those.'   Objective:Patient seen and chart reviewed.Discussed patient with treatment team.  Pt seen this AM as paranoid , guarded , unable to participate in evaluation. He answers questions in monosyllables , states " I did not do that " or I am OK.' Patient's daughter Marc Summers had brought in some notes that pt had written down at home - Clinical research associate reviewed it. Pt had made several comments about charges and trials in it. He had written down about 1 st degree murder of jesus , dog poisoned attacks , automobile attacks , spider web tattoos and had made random statements like that . When asked to clarify , pt shut down , reported " That is not me." When asked to write down a word in a piece of paper , pt declined.  Per RN , pt continues to decline medications.       Principal Problem: Schizophrenia (HCC) ( per hx ) Diagnosis:   Patient Active Problem List   Diagnosis Date Noted  . Schizophrenia (HCC) [F20.9] 10/28/2016  . Anemia [D64.9] 06/22/2012  . Unresponsive episode [R41.89] 06/22/2012  . Acute encephalopathy [G93.40] 06/22/2012  . Hypokalemia [E87.6] 06/21/2012  . Hypothermia [T68.XXXA] 06/21/2012  . Delusional disorder(297.1) [F22] 04/10/2012  . Unspecified episodic mood disorder [F39] 04/10/2012   Total Time spent with patient: 30 minutes  Past Psychiatric History: hospital admission  Past Medical History:  Past Medical History:  Diagnosis Date  . Mental disorder     Past Surgical History:  Procedure Laterality Date  . rt foot surgery  2002  . rt kidney surgery     Family History: Please see H&P.  Family Psychiatric  History: mom: schizophrenia Social History:  History  Alcohol Use No     History  Drug Use  . Types: Marijuana    Comment: in high school    Social History   Social History  . Marital status: Married   Spouse name: N/A  . Number of children: N/A  . Years of education: N/A   Social History Main Topics  . Smoking status: Former Games developer  . Smokeless tobacco: Never Used  . Alcohol use No  . Drug use: Yes    Types: Marijuana     Comment: in high school  . Sexual activity: No   Other Topics Concern  . None   Social History Narrative  . None   Additional Social History:    Pain Medications: Patient denies Prescriptions: Patient denies Over the Counter: Patient denies History of alcohol / drug use?: Yes Longest period of sobriety (when/how long): Since 2007 Name of Substance 1: Alcohol 1 - Age of First Use: Unknown 1 - Amount (size/oz): Unknow 1 - Frequency: Unknown 1 - Duration: Unknown 1 - Last Use / Amount: Unknown                  Sleep: Fair  Appetite:  Fair  Current Medications: Current Facility-Administered Medications  Medication Dose Route Frequency Provider Last Rate Last Dose  . acetaminophen (TYLENOL) tablet 650 mg  650 mg Oral Q6H PRN Oneta Rack, NP      . alum & mag hydroxide-simeth (MAALOX/MYLANTA) 200-200-20 MG/5ML suspension 30 mL  30 mL Oral Q4H PRN Oneta Rack, NP      . ARIPiprazole (ABILIFY) tablet 5 mg  5 mg Oral Daily  Thresa Ross, MD      . FLUoxetine (PROZAC) capsule 10 mg  10 mg Oral Daily Fransisca Kaufmann A, NP      . magnesium hydroxide (MILK OF MAGNESIA) suspension 30 mL  30 mL Oral Daily PRN Oneta Rack, NP      . traZODone (DESYREL) tablet 50 mg  50 mg Oral QHS PRN Oneta Rack, NP        Lab Results: No results found for this or any previous visit (from the past 48 hour(s)).  Blood Alcohol level:  Lab Results  Component Value Date   ETH <5 10/25/2016   ETH <11 06/21/2012    Metabolic Disorder Labs: No results found for: HGBA1C, MPG No results found for: PROLACTIN No results found for: CHOL, TRIG, HDL, CHOLHDL, VLDL, LDLCALC  Physical Findings: AIMS: Facial and Oral Movements Muscles of Facial Expression:  None, normal Lips and Perioral Area: None, normal Jaw: None, normal Tongue: None, normal,Extremity Movements Upper (arms, wrists, hands, fingers): None, normal Lower (legs, knees, ankles, toes): None, normal, Trunk Movements Neck, shoulders, hips: None, normal, Overall Severity Severity of abnormal movements (highest score from questions above): None, normal Incapacitation due to abnormal movements: None, normal Patient's awareness of abnormal movements (rate only patient's report): No Awareness, Dental Status Current problems with teeth and/or dentures?: No Does patient usually wear dentures?: No  CIWA:  CIWA-Ar Total: 1 COWS:  COWS Total Score: 1  Musculoskeletal: Strength & Muscle Tone: within normal limits Gait & Station: normal Patient leans: N/A  Psychiatric Specialty Exam: Physical Exam  Nursing note and vitals reviewed.   Review of Systems  Psychiatric/Behavioral: The patient is nervous/anxious.   All other systems reviewed and are negative.   Blood pressure 125/76, pulse (!) 55, temperature 98 F (36.7 C), temperature source Oral, resp. rate 19, height 5\' 9"  (1.753 m), weight 95.7 kg (211 lb).Body mass index is 31.16 kg/m.  General Appearance: Casual  Eye Contact:  Fair  Speech:  Normal Rate  Volume:  Decreased  Mood:  Anxious   Affect:  Constricted  Thought Process: linear   Orientation:  Full (Time, Place, and Person)  Thought Content:  Paranoid Ideation  Suicidal Thoughts:  No  Homicidal Thoughts:  No   Memory:immediate - fair, recent - fair , remote - fair  Judgement:  Poor  Insight:  Shallow  Psychomotor Activity:  Decreased  Concentration: fair  Recall:  fair  Fund of Knowledge:  Fair  Language:  Fair  Akathisia:  Negative  Handed:  Right  AIMS (if indicated):     Assets:  Social Support  ADL's:  Intact  Cognition:  WNL  Sleep:  Number of Hours: 5   Schizophrenia (HCC) unstable  Will continue today 10/29/16  plan as below except where it is  noted.     Treatment Plan Summary:Patient with delusions and paranoia , daughter who spoke to Clinical research associate reported that pt had made several delusonal statements in his journal . Writer reviewed his journal and discussed with pt to clarify , however he declined writing any of that. Continue to educate patient and encourage medications.  Daily contact with patient to assess and evaluate symptoms and progress in treatment, Medication management and Plan see below  No changes today - he continues to decline meds offered. Does not meet criteria for forced medications. Reviewed past medical records,treatment plan.  Pt declines medications, will offer PRN medications. Will continue to monitor vitals ,medication compliance and treatment side effects while patient is  here.  Will monitor for medical issues as well as call consult as needed.  CSW will continue to work on disposition.  Patient to participate in therapeutic milieu .       Ilija Maxim, MD 10/29/2016, 2:33 PM

## 2016-10-29 NOTE — Progress Notes (Signed)
D:  Patient denied SI and HI, contracts for safety.  Safety maintained with 15 minute checks.  Denied A/V hallucinations. A:  Medications refused by patient this morning, abilify and prozac. R:  Denied SI and HI, contracts for safety.  Safety maintained with 15 minute checks.

## 2016-10-30 NOTE — Progress Notes (Signed)
Did  Not attend group 

## 2016-10-30 NOTE — [facility] Group Notes (Signed)
[facility] LCSW Group Therapy 10/30/2016 1:15 PM  Type of Therapy: Group Therapy- Emotion Regulation  Participation Level: Reserved  Participation Quality:  Attentive  Affect: Appropriate  Cognitive: Alert and Oriented   Insight:  Unable to assess  Engagement in Therapy: Limited   Modes of Intervention: Clarification, Confrontation, Discussion, Education, Exploration, Limit-setting, Orientation, Problem-solving, Rapport Building, Dance movement psychotherapisteality Testing, Socialization and Support  Summary of Progress/Problems: The topic for group today was emotional regulation. This group focused on both positive and negative emotion identification and allowed group members to process ways to identify feelings, regulate negative emotions, and find healthy ways to manage internal/external emotions. Group members were asked to reflect on a time when their reaction to an emotion led to a negative outcome and explored how alternative responses using emotion regulation would have benefited them. Group members were also asked to discuss a time when emotion regulation was utilized when a negative emotion was experienced. Pt did not participate in discussion but did report that he was having a good day and had no needs at this time. He remained attentive throughout group discussion.    Vernie ShanksLauren Davidmichael Zarazua, LCSW 10/30/2016 11:02 AM

## 2016-10-30 NOTE — Plan of Care (Signed)
Problem: Education: Goal: Utilization of techniques to improve thought processes will improve Outcome: Progressing Nurse discussed depression/anxiety/coping skills with patient.    

## 2016-10-30 NOTE — Progress Notes (Addendum)
Nursing Progress Note 1900-0730  D) Patient presents flat and depressed this shift. Patient is isolative to his room and did not attend group. Patient reported he is "tired" and denies further needs. Patient denies SI/HI/AVH or pain. Patient contracts for safety on the unit. Patient reports sleeping well.  A) Emotional support given. 1:1 interaction and active listening provided. No medications ordered/requested this evening. Snacks and fluids provided. Opportunities for questions or concerns presented to patient. Patient encouraged to continue to work on treatment goals. Labs, vital signs and patient behavior monitored throughout shift. Patient safety maintained with q15 min safety checks. Low fall risk precautions in place and reviewed with patient; patient verbalized understanding.  R) Patient receptive to interaction with nurse. Patient remains safe on the unit at this time. Patient is resting in bed without complaints. Will continue to monitor.

## 2016-10-30 NOTE — Progress Notes (Signed)
Recreation Therapy Notes  Date: 10/30/16 Time: 1000 Location: 500 Hall Dayroom  Group Topic: Leisure Education  Goal Area(s) Addresses:  Patient will identify positive leisure activities.  Patient will identify one positive benefit of participation in leisure activities.   Behavioral Response: None  Intervention: Can with strips of paper with various words, dry erase marker, dry erase board, eraser  Activity: Pictionary.  Patients were select a slip of paper from the can.  The patient was to draw what was on the paper.  The remaining patients were to guess what the drawing was.  The patient drawing the picture could not talk or give clues.  The person who guessed the picture correctly would get the next opportunity.  Education:  Leisure Education, Building control surveyorDischarge Planning  Education Outcome: Acknowledges education/In group clarification offered/Needs additional education  Clinical Observations/Feedback: Pt did not participate.  Pt sat and observed for about 5 minutes before leaving and not returning.  Caroll RancherMarjette Montrel Donahoe, LRT/CTRS         Lillia AbedLindsay, Chaylee Ehrsam A 10/30/2016 11:13 AM

## 2016-10-30 NOTE — Progress Notes (Addendum)
[facility] MD Progress Note  10/30/2016 2:29 PM Marc Summers  MRN:  [id number]   Subjective: Patient states " I am fine."    Objective:Patient seen and chart reviewed.Discussed patient with treatment team. Pt remains guarded , noted as calm in milieu.  Pt per nursing remains pleasant , has not had any AH/VH or paranoia . Pt continues to decline medications, continues to report he has no concerns and he declines that he wrote the journal that has all the delusional statements on it , which was brought in by his daughter.        Principal Problem: Schizophrenia (HCC) ( per hx ) Diagnosis:   Patient Active Problem List   Diagnosis Date Noted  . Schizophrenia (HCC) [F20.9] 10/28/2016  . Anemia [D64.9] 06/22/2012  . Unresponsive episode [R41.89] 06/22/2012  . Acute encephalopathy [G93.40] 06/22/2012  . Hypokalemia [E87.6] 06/21/2012  . Hypothermia [T68.XXXA] 06/21/2012  . Delusional disorder(297.1) [F22] 04/10/2012  . Unspecified episodic mood disorder [F39] 04/10/2012   Total Time spent with patient: 20 minutes  Past Psychiatric History: hospital admission  Past Medical History:  Past Medical History:  Diagnosis Date  . Mental disorder     Past Surgical History:  Procedure Laterality Date  . rt foot surgery  2002  . rt kidney surgery     Family History: Please see H&P.  Family Psychiatric  History: mom: schizophrenia Social History:  History  Alcohol Use No     History  Drug Use  . Types: Marijuana    Comment: in high school    Social History   Social History  . Marital status: Married    Spouse name: N/A  . Number of children: N/A  . Years of education: N/A   Social History Main Topics  . Smoking status: Former Games developer  . Smokeless tobacco: Never Used  . Alcohol use No  . Drug use: Yes    Types: Marijuana     Comment: in high school  . Sexual activity: No   Other Topics Concern  . None   Social History Narrative  . None   Additional Social  History:    Pain Medications: Patient denies Prescriptions: Patient denies Over the Counter: Patient denies History of alcohol / drug use?: Yes Longest period of sobriety (when/how long): Since 2007 Name of Substance 1: Alcohol 1 - Age of First Use: Unknown 1 - Amount (size/oz): Unknow 1 - Frequency: Unknown 1 - Duration: Unknown 1 - Last Use / Amount: Unknown                  Sleep: Fair  Appetite:  Fair  Current Medications: Current Facility-Administered Medications  Medication Dose Route Frequency Provider Last Rate Last Dose  . acetaminophen (TYLENOL) tablet 650 mg  650 mg Oral Q6H PRN Oneta Rack, NP      . alum & mag hydroxide-simeth (MAALOX/MYLANTA) 200-200-20 MG/5ML suspension 30 mL  30 mL Oral Q4H PRN Oneta Rack, NP      . ARIPiprazole (ABILIFY) tablet 5 mg  5 mg Oral Daily Thresa Ross, MD      . FLUoxetine (PROZAC) capsule 10 mg  10 mg Oral Daily Fransisca Kaufmann A, NP      . magnesium hydroxide (MILK OF MAGNESIA) suspension 30 mL  30 mL Oral Daily PRN Oneta Rack, NP      . traZODone (DESYREL) tablet 50 mg  50 mg Oral QHS PRN Oneta Rack, NP  Lab Results: No results found for this or any previous visit (from the past 48 hour(s)).  Blood Alcohol level:  Lab Results  Component Value Date   ETH <5 10/25/2016   ETH <11 06/21/2012    Metabolic Disorder Labs: No results found for: HGBA1C, MPG No results found for: PROLACTIN No results found for: CHOL, TRIG, HDL, CHOLHDL, VLDL, LDLCALC  Physical Findings: AIMS: Facial and Oral Movements Muscles of Facial Expression: None, normal Lips and Perioral Area: None, normal Jaw: None, normal Tongue: None, normal,Extremity Movements Upper (arms, wrists, hands, fingers): None, normal Lower (legs, knees, ankles, toes): None, normal, Trunk Movements Neck, shoulders, hips: None, normal, Overall Severity Severity of abnormal movements (highest score from questions above): None,  normal Incapacitation due to abnormal movements: None, normal Patient's awareness of abnormal movements (rate only patient's report): No Awareness, Dental Status Current problems with teeth and/or dentures?: No Does patient usually wear dentures?: No  CIWA:  CIWA-Ar Total: 1 COWS:  COWS Total Score: 1  Musculoskeletal: Strength & Muscle Tone: within normal limits Gait & Station: normal Patient leans: N/A  Psychiatric Specialty Exam: Physical Exam  Nursing note and vitals reviewed.   Review of Systems  Psychiatric/Behavioral: The patient is nervous/anxious.   All other systems reviewed and are negative.   Blood pressure 101/72, pulse 61, temperature 98.3 F (36.8 C), resp. rate 18, height 5\' 9"  (1.753 m), weight 95.7 kg (211 lb).Body mass index is 31.16 kg/m.  General Appearance: Casual  Eye Contact:  Fair  Speech:  Normal Rate  Volume:  Decreased  Mood: anxious   Affect:  Constricted  Thought Process: linear  Orientation:  Full (Time, Place, and Person)  Thought Content:  Paranoid Ideation  Suicidal Thoughts:  No  Homicidal Thoughts:  No   Memory:immediate , fair, recent- fair, remote - fair  Judgement:  Poor  Insight:  Shallow  Psychomotor Activity:  Decreased  Concentration: fair  Recall:  fair  Fund of Knowledge:  Fair  Language:  Fair  Akathisia:  Negative  Handed:  Right  AIMS (if indicated):     Assets:  Social Support  ADL's:  Intact  Cognition:  WNL  Sleep:  Number of Hours: 5   Schizophrenia (HCC) per hx   Will continue today 10/30/16 plan as below except where it is noted.       Treatment Plan Summary:Patient presented after making some delusional statements , his daughter brought in a journal which has a lot of writing ( per patient according to his daughter) , his writings are noted as delusional and is about cannibalism, murder and jesus . Pt however continues to decline he wrote it and shuts down when a discussion is initiated with him. Pt  declines any medications at this time and does not appear to meet criteria for forced medications since he is calm , cooperative in milieu.  Hence CSW could refer pt to outpatient provider and if he continues to improve - could possibly discharge tomorrow- Thursday.   Daily contact with patient to assess and evaluate symptoms and progress in treatment, Medication management and Plan see below  No changes today - he continues to decline meds offered. Does not meet criteria for forced medications. Reviewed past medical records,treatment plan.  Pt declines medications, will offer PRN medications. Will continue to monitor vitals ,medication compliance and treatment side effects while patient is here.  Will monitor for medical issues as well as call consult as needed.  CSW will continue to work on  disposition.  Patient to participate in therapeutic milieu .       Jillaine Waren, MD 10/30/2016, 2:29 PM

## 2016-10-30 NOTE — Progress Notes (Signed)
D:  Patient denied SI and HI, contracts for safety.  Denied A/V hallucinations.  Denied pain.   A:  Patient refused all medications today.  Emotional support and encouragement given patient. R:  Safety maintained with 15 minute checks.

## 2016-10-30 NOTE — Plan of Care (Signed)
Problem: Activity: Goal: Sleeping patterns will improve Outcome: Progressing Patient reports he is sleeping well and denies need for sleep medications.  Problem: Safety: Goal: Periods of time without injury will increase Outcome: Progressing Patient is on q15 minute safety checks and low fall risk precautions. Patient contracts for safety on the unit and remains safe at this time.

## 2016-10-31 NOTE — Progress Notes (Signed)
Nursing Progress Note 1900-0730  D) Patient presents pleasant with staff and peers but remains delusional at times. Patient attended group this evening and was observed up in the milieu. Patient denies SI/HI/AVH or pain. Patient contracts for safety on the unit. Patient reports sleeping well. Patient denied questions or concerns for writer this evening.  A) Emotional support given. 1:1 interaction and active listening provided. Patient medicated as prescribed. No medications scheduled/requested this evening. Snacks and fluids provided. Opportunities for questions or concerns presented to patient. Patient encouraged to continue to work on treatment goals. Labs, vital signs and patient behavior monitored throughout shift. Patient safety maintained with q15 min safety checks. Low fall risk precautions in place and reviewed with patient; patient verbalized understanding.  R) Patient receptive to interaction with nurse. Patient remains safe on the unit at this time. Patient is resting in bed without complaints. Will continue to monitor.

## 2016-10-31 NOTE — Progress Notes (Signed)
[facility] Group Notes:  (Nursing/MHT/Case Management/Adjunct)  Date:  10/31/2016  Time:  8:50 PM  Type of Therapy:  Psychoeducational Skills  Participation Level:  Minimal  Participation Quality:  Attentive  Affect:  Flat  Cognitive:  Lacking  Insight:  None  Engagement in Group:  Resistant  Modes of Intervention:  Education  Summary of Progress/Problems: Patient stated that he had a "great day" but would not explain any further. The patient would not state a goal for tomorrow.   Capricia Serda S 10/31/2016, 8:50 PM

## 2016-10-31 NOTE — Progress Notes (Signed)
[facility] MD Progress Note  10/31/2016 3:03 PM Marc Summers  MRN:  [id number]  Subjective: Marc Summers reports, "I'm doing great. There is nothing wrong with me".  Objective: Patient seen and chart reviewed.Discussed patient with the treatment team. Marc Summers is seen. Chart reviewed. He presents disheveled, however, verbally responsive with good eye contact. He presents paranoid , guarded. He answers questions in monosyllables. He says he is doing great & has no reasons to take medications. He says there is nothing wrong with him. He denies any SIHI, AVH, delusional thoughts or paranoia. Per Dr. Elna Breslow, Patient's daughter Marc Summers had brought in some notes that pt had written down at home - writer reviewed it. Pt had made several comments about charges and trials in it. He had written down about 1st degree murder of Marc Summers, dog poisoned attacks , automobile attacks, spider web tattoos and had made random statements like that . When asked to clarify , pt shut down, reported " That is not me." When asked to write down a word in a piece of paper , pt declined.  Per RN , pt continues to decline medications.  Principal Problem: Schizophrenia (HCC) ( per hx ) Diagnosis:   Patient Active Problem List   Diagnosis Date Noted  . Schizophrenia (HCC) [F20.9] 10/28/2016  . Anemia [D64.9] 06/22/2012  . Unresponsive episode [R41.89] 06/22/2012  . Acute encephalopathy [G93.40] 06/22/2012  . Hypokalemia [E87.6] 06/21/2012  . Hypothermia [T68.XXXA] 06/21/2012  . Delusional disorder(297.1) [F22] 04/10/2012  . Unspecified episodic mood disorder [F39] 04/10/2012   Total Time spent with patient: 15 minutes  Past Psychiatric History: Hospital admissions  Past Medical History:  Past Medical History:  Diagnosis Date  . Mental disorder     Past Surgical History:  Procedure Laterality Date  . rt foot surgery  2002  . rt kidney surgery     Family History: Please see H&P.  Family Psychiatric  History: mom:  Schizophrenia  Social History:  History  Alcohol Use No     History  Drug Use  . Types: Marijuana    Comment: in high school    Social History   Social History  . Marital status: Married    Spouse name: N/A  . Number of children: N/A  . Years of education: N/A   Social History Main Topics  . Smoking status: Former Games developer  . Smokeless tobacco: Never Used  . Alcohol use No  . Drug use: Yes    Types: Marijuana     Comment: in high school  . Sexual activity: No   Other Topics Concern  . None   Social History Narrative  . None   Additional Social History:    Pain Medications: Patient denies Prescriptions: Patient denies Over the Counter: Patient denies History of alcohol / drug use?: Yes Longest period of sobriety (when/how long): Since 2007 Name of Substance 1: Alcohol 1 - Age of First Use: Unknown 1 - Amount (size/oz): Unknow 1 - Frequency: Unknown 1 - Duration: Unknown 1 - Last Use / Amount: Unknown  Sleep: Fair  Appetite:  Fair  Current Medications: Current Facility-Administered Medications  Medication Dose Route Frequency Provider Last Rate Last Dose  . acetaminophen (TYLENOL) tablet 650 mg  650 mg Oral Q6H PRN Oneta Rack, NP      . alum & mag hydroxide-simeth (MAALOX/MYLANTA) 200-200-20 MG/5ML suspension 30 mL  30 mL Oral Q4H PRN Oneta Rack, NP      . ARIPiprazole (ABILIFY) tablet 5 mg  5  mg Oral Daily Thresa RossAkhtar, Nadeem, MD      . FLUoxetine (PROZAC) capsule 10 mg  10 mg Oral Daily Fransisca Kaufmannavis, Laura A, NP      . magnesium hydroxide (MILK OF MAGNESIA) suspension 30 mL  30 mL Oral Daily PRN Oneta RackLewis, Tanika N, NP      . traZODone (DESYREL) tablet 50 mg  50 mg Oral QHS PRN Oneta RackLewis, Tanika N, NP       Lab Results: No results found for this or any previous visit (from the past 48 hour(s)).  Blood Alcohol level:  Lab Results  Component Value Date   ETH <5 10/25/2016   ETH <11 06/21/2012    Metabolic Disorder Labs: No results found for: HGBA1C, MPG No  results found for: PROLACTIN No results found for: CHOL, TRIG, HDL, CHOLHDL, VLDL, LDLCALC  Physical Findings: AIMS: Facial and Oral Movements Muscles of Facial Expression: None, normal Lips and Perioral Area: None, normal Jaw: None, normal Tongue: None, normal,Extremity Movements Upper (arms, wrists, hands, fingers): None, normal Lower (legs, knees, ankles, toes): None, normal, Trunk Movements Neck, shoulders, hips: None, normal, Overall Severity Severity of abnormal movements (highest score from questions above): None, normal Incapacitation due to abnormal movements: None, normal Patient's awareness of abnormal movements (rate only patient's report): No Awareness, Dental Status Current problems with teeth and/or dentures?: No Does patient usually wear dentures?: No  CIWA:  CIWA-Ar Total: 1 COWS:  COWS Total Score: 1  Musculoskeletal: Strength & Muscle Tone: within normal limits Gait & Station: normal Patient leans: N/A  Psychiatric Specialty Exam: Physical Exam  Nursing note and vitals reviewed.   Review of Systems  Psychiatric/Behavioral: The patient is nervous/anxious.   All other systems reviewed and are negative.   Blood pressure 115/85, pulse (!) 53, temperature 99.1 F (37.3 C), resp. rate 18, height 5\' 9"  (1.753 m), weight 95.7 kg (211 lb).Body mass index is 31.16 kg/m.  General Appearance: Casual  Eye Contact:  Fair  Speech:  Normal Rate  Volume:  Decreased  Mood:  Anxious   Affect:  Constricted  Thought Process: linear   Orientation:  Full (Time, Place, and Person)  Thought Content:  Paranoid Ideation  Suicidal Thoughts:  No  Homicidal Thoughts:  No   Memory:immediate - fair, recent - fair , remote - fair  Judgement:  Poor  Insight:  Shallow  Psychomotor Activity:  Decreased  Concentration: fair  Recall:  fair  Fund of Knowledge:  Fair  Language:  Fair  Akathisia:  Negative  Handed:  Right  AIMS (if indicated):     Assets:  Social Support  ADL's:   Intact  Cognition:  WNL  Sleep:  Number of Hours: 5   Schizophrenia (HCC) Unstable  Will continue today 10/31/16  plan as below except where it is noted.  Treatment Plan Summary: Patient with delusions and paranoia, daughter who spoke to Clinical research associatewriter reported that pt had made several delusonal statements in his journal. Writer reviewed his journal and discussed with pt to clarify, however he declined writing any of that. Continue to educate patient and encourage medications.  Daily contact with patient to assess and evaluate symptoms and progress in treatment, Medication management and Plan see below  No changes today - he continues to decline meds offered. Does not meet criteria for forced medications. Reviewed past medical records,treatment plan.  Pt declines medications, will offer PRN medications. Will continue to monitor vitals ,medication compliance and treatment side effects while patient is here.  Will monitor for medical  issues as well as call consult as needed.  CSW will continue to work on disposition.  Patient to participate in therapeutic milieu .   Sanjuana Kava, NP, PMHNP, FNP-BC 10/31/2016, 3:03 PM  Patient ID: Geraldine Solar, male   DOB: 10-03-1960, 56 y.o.   MRN: [id number]

## 2016-10-31 NOTE — [facility] Group Notes (Signed)
[facility] 10/31/2016 1:15pm  Type of Therapy: Mental Health Association Presentation  Participation Level: Active  Participation Quality: Attentive  Affect: Appropriate  Cognitive: Oriented  Insight: Developing/Improving  Engagement in Therapy: Engaged  Modes of Intervention: Discussion, Education and Socialization  Summary of Progress/Problems: Mental Health Association (MHA) Speaker came to talk about his personal journey with substance abuse and addiction. The pt processed ways by which to relate to the speaker. MHA speaker provided handouts and educational information pertaining to groups and services offered by the [facility]. Pt was engaged in speaker's presentation and was receptive to resources provided.    Vernie ShanksLauren Nuno Brubacher, LCSW 10/31/2016 12:28 PM

## 2016-10-31 NOTE — Progress Notes (Signed)
Recreation Therapy Notes  Date: 10/31/2016 Time: 10:00am Location: [address] Dayroom  Group Topic: Leisure and Lifestyle Education  Goal Area(s) Addresses:  Patient will be able to successfully identify solutions to barriers they have experienced when they have tried to participate in leisure activities. Patient will be able to link the solutions to barriers they face in their everyday lives.  Behavioral Response: Minimal  Intervention: Scientist, clinical (histocompatibility and immunogenetics)Construction Paper, Scissors, Pencils  Activity: Pt will cut red construction paper into bricks and put one barrier they have faced while trying to participate in a leisure activity on 3-4 cut out bricks. Pt will then identify solutions to the barriers they have faced.  Education:  Leisure and Lifestyle Education, Discharge Planning  Education Outcome: Acknowledges education  Clinical Observations/Feedback: Pt actively participated in the opening group discussion by stating that he enjoys cooking and cutting his grass. Pt did not participate in activity, but remained in group while respectfully listening to his peers identify solutions to the barriers they have faced while trying to participate in leisure activities.  Marvell Fullerachel Meyer, Recreation Therapy Intern  Caroll RancherMarjette Marylin Lathon, LRT/CTRS

## 2016-10-31 NOTE — Plan of Care (Signed)
Problem: Activity: Goal: Interest or engagement in activities will improve Outcome: Progressing Patient is visible interacting with peers and staff in the milieu. Patient attended group this evening.  Problem: Safety: Goal: Periods of time without injury will increase Outcome: Progressing Patient is on q15 minute safety checks and low fall risk precautions. Patient contracts for safety on the unit and remains safe at this time.

## 2016-10-31 NOTE — Progress Notes (Signed)
DAR NOTE: Patient presents with anxious affect and depressed mood. Pt has been calm,plesant and cooperative with care and unit rules and present in the milieu. Denies pain, auditory and visual hallucinations. Rates depression at 0, hopelessness at 0, and anxiety at 0.  Maintained on routine safety checks.  Medications offred as prescribed but pt declined stating he is not sick.  Support and encouragement offered as needed.  Attended group and participated.  Patient observed socializing with peers in the dayroom.  Offered no complaint.

## 2016-11-01 NOTE — [facility] Group Notes (Signed)
Spiritual care group focused on theme of "care."   Participants worked to finish the sentence "care is" and engaged in facilitated dialog around theme.  Looked at Paramedicvisual explorer photos and picked a photo that represented the theme of care in their life.     Marc Summers was present through majority of group.   Not engaged in group discussion until prompted by facilitator, and replied he "is just listening"    When group transitioned to visual explorer activity, Marc Summers left group.      [facility] Marc KingfisherMatthew Keller Summers, MDiv

## 2016-11-01 NOTE — Progress Notes (Signed)
[facility] MD Progress Note  11/01/2016 2:34 PM Marc Summers  MRN:  [id number]  Subjective: Marc Summers reports, "It is still going well for me. I do believe that I was brought in here for no fault of mine. I still did not see any need for medicines. I feel good".  Objective: Marc Summers is seen. Chart reviewed. He presents disheveled, however, verbally responsive with good eye contact. He presents paranoid, guarded. He answers questions in monosyllables. He says he is doing great & has no reasons to take medications. He says there is nothing wrong with him. His admission to this hospital was under IVC due to bizarre behavior. He participates in groups. Keeps to himself. Continue to deny any problems. He remains on IVC. He denies any SIHI, AVH, delusional thoughts or paranoia. Per Dr. Elna Breslow, Patient's daughter Marc Summers had brought in some notes that pt had written down at home - writer reviewed it. Pt had made several comments about charges and trials in it. He had written down about 1st degree murder of Jesus, dog poisoned attacks, automobile attacks, spider web tattoos and had made random statements like that . When asked to clarify, pt shut down, reported " That is not me." When asked to write down a word in a piece of paper , pt declined.  Per RN, pt continues to decline medications.  Principal Problem: Schizophrenia (HCC) ( per hx ) Diagnosis:   Patient Active Problem List   Diagnosis Date Noted  . Schizophrenia (HCC) [F20.9] 10/28/2016  . Anemia [D64.9] 06/22/2012  . Unresponsive episode [R41.89] 06/22/2012  . Acute encephalopathy [G93.40] 06/22/2012  . Hypokalemia [E87.6] 06/21/2012  . Hypothermia [T68.XXXA] 06/21/2012  . Delusional disorder(297.1) [F22] 04/10/2012  . Unspecified episodic mood disorder [F39] 04/10/2012   Total Time spent with patient: 15 minutes  Past Psychiatric History: Hospital admissions  Past Medical History:  Past Medical History:  Diagnosis Date  . Mental  disorder     Past Surgical History:  Procedure Laterality Date  . rt foot surgery  2002  . rt kidney surgery     Family History: Please see H&P.  Family Psychiatric  History: mom: Schizophrenia  Social History:  History  Alcohol Use No     History  Drug Use  . Types: Marijuana    Comment: in high school    Social History   Social History  . Marital status: Married    Spouse name: N/A  . Number of children: N/A  . Years of education: N/A   Social History Main Topics  . Smoking status: Former Games developer  . Smokeless tobacco: Never Used  . Alcohol use No  . Drug use: Yes    Types: Marijuana     Comment: in high school  . Sexual activity: No   Other Topics Concern  . None   Social History Narrative  . None   Additional Social History:    Pain Medications: Patient denies Prescriptions: Patient denies Over the Counter: Patient denies History of alcohol / drug use?: Yes Longest period of sobriety (when/how long): Since 2007 Name of Substance 1: Alcohol 1 - Age of First Use: Unknown 1 - Amount (size/oz): Unknow 1 - Frequency: Unknown 1 - Duration: Unknown 1 - Last Use / Amount: Unknown  Sleep: Fair  Appetite:  Fair  Current Medications: Current Facility-Administered Medications  Medication Dose Route Frequency Provider Last Rate Last Dose  . acetaminophen (TYLENOL) tablet 650 mg  650 mg Oral Q6H PRN Oneta Rack, NP      .  alum & mag hydroxide-simeth (MAALOX/MYLANTA) 200-200-20 MG/5ML suspension 30 mL  30 mL Oral Q4H PRN Oneta Rack, NP      . ARIPiprazole (ABILIFY) tablet 5 mg  5 mg Oral Daily Thresa Ross, MD      . FLUoxetine (PROZAC) capsule 10 mg  10 mg Oral Daily Fransisca Kaufmann A, NP      . magnesium hydroxide (MILK OF MAGNESIA) suspension 30 mL  30 mL Oral Daily PRN Oneta Rack, NP      . traZODone (DESYREL) tablet 50 mg  50 mg Oral QHS PRN Oneta Rack, NP       Lab Results: No results found for this or any previous visit (from the past  48 hour(s)).  Blood Alcohol level:  Lab Results  Component Value Date   ETH <5 10/25/2016   ETH <11 06/21/2012    Metabolic Disorder Labs: No results found for: HGBA1C, MPG No results found for: PROLACTIN No results found for: CHOL, TRIG, HDL, CHOLHDL, VLDL, LDLCALC  Physical Findings: AIMS: Facial and Oral Movements Muscles of Facial Expression: None, normal Lips and Perioral Area: None, normal Jaw: None, normal Tongue: None, normal,Extremity Movements Upper (arms, wrists, hands, fingers): None, normal Lower (legs, knees, ankles, toes): None, normal, Trunk Movements Neck, shoulders, hips: None, normal, Overall Severity Severity of abnormal movements (highest score from questions above): None, normal Incapacitation due to abnormal movements: None, normal Patient's awareness of abnormal movements (rate only patient's report): No Awareness, Dental Status Current problems with teeth and/or dentures?: No Does patient usually wear dentures?: No  CIWA:  CIWA-Ar Total: 1 COWS:  COWS Total Score: 1  Musculoskeletal: Strength & Muscle Tone: within normal limits Gait & Station: normal Patient leans: N/A  Psychiatric Specialty Exam: Physical Exam  Nursing note and vitals reviewed.   Review of Systems  Psychiatric/Behavioral: The patient is nervous/anxious.   All other systems reviewed and are negative.   Blood pressure 111/76, pulse 64, temperature 97.7 F (36.5 C), temperature source Oral, resp. rate 16, height 5\' 9"  (1.753 m), weight 95.7 kg (211 lb).Body mass index is 31.16 kg/m.  General Appearance: Casual  Eye Contact:  Fair  Speech:  Normal Rate  Volume:  Decreased  Mood:  Anxious   Affect:  Constricted  Thought Process: linear   Orientation:  Full (Time, Place, and Person)  Thought Content:  Paranoid Ideation  Suicidal Thoughts:  No  Homicidal Thoughts:  No   Memory:immediate - fair, recent - fair , remote - fair  Judgement:  Poor  Insight:  Shallow   Psychomotor Activity:  Decreased  Concentration: fair  Recall:  fair  Fund of Knowledge:  Fair  Language:  Fair  Akathisia:  Negative  Handed:  Right  AIMS (if indicated):     Assets:  Social Support  ADL's:  Intact  Cognition:  WNL  Sleep:  Number of Hours: 5.5   Schizophrenia (HCC) Unstable  Will continue today 11/01/16  plan as below except where it is noted.  Treatment Plan Summary: Patient with delusions and paranoia, daughter who spoke to Clinical research associate reported that pt had made several delusonal statements in his journal. Writer reviewed his journal and discussed with pt to clarify, however he declined writing any of that. Continue to educate patient and encourage medications.  Daily contact with patient to assess and evaluate symptoms and progress in treatment, Medication management and Plan see below  No changes today - Marshawn continues to decline meds offered. Does not meet criteria for  forced medications. Reviewed past medical records, treatment plan.  Pt declines medications, will offer PRN medications. Will continue to monitor vitals, medication compliance and treatment side effects while patient is here.  Will monitor for medical issues as well as call consult as needed.  CSW will continue to work on disposition.  Patient to participate in therapeutic milieu .   Sanjuana KavaNwoko, Agnes I, NP, PMHNP, FNP-BC 11/01/2016, 2:34 PM  Patient ID: Geraldine Solaronald I Heuerman, male   DOB: 1960-05-16, 56 y.o.   MRN: [id number]

## 2016-11-01 NOTE — Progress Notes (Signed)
Adult Psychoeducational Group Note  Date:  11/01/2016 Time:  9:00 PM  Group Topic/Focus:  Wrap-Up Group:   The focus of this group is to help patients review their daily goal of treatment and discuss progress on daily workbooks.  Participation Level:  Minimal  Participation Quality:  Appropriate  Affect:  Appropriate  Cognitive:  Appropriate  Insight: Appropriate  Engagement in Group:  Engaged and Improving  Modes of Intervention:  Discussion  Additional Comments:  Pt stated his goal for today was to interact more with peers. Pt felt he accomplished this goal today. Pt rated his day and overall 8 out of 10.   Marc FurnaceChristopher  Ethelbert Summers 11/01/2016, 9:00 PM

## 2016-11-01 NOTE — Progress Notes (Signed)
Pt has been calm the whole day, pt been pleasant and cooperative with care. Pt refused taking his medication stating that he is not sick and therefore does not need medications. Pt denies SI/HI AVH.  Support and encouragement offered as needed.  Attended group and participated. Patient observed socializing with peers in the dayroom.  Offered no complaint.

## 2016-11-01 NOTE — Plan of Care (Signed)
Problem: Safety: Goal: Ability to disclose and discuss suicidal ideas will improve Outcome: Completed/Met Date Met: 11/01/16 Marc Summers denies any suicidal ideation, "I am fine, I don't need anything"

## 2016-11-01 NOTE — Tx Team (Signed)
Interdisciplinary Treatment and Diagnostic Plan Update  11/01/2016 Time of Session: 2:40 PM  Marc Summers MRN: [id number]  Principal Diagnosis: Schizophrenia [facility])  Secondary Diagnoses: Principal Problem:   Schizophrenia ([place])   Current Medications:  Current Facility-Administered Medications  Medication Dose Route Frequency Provider Last Rate Last Dose  . acetaminophen (TYLENOL) tablet 650 mg  650 mg Oral Q6H PRN Derrill Center, NP      . alum & mag hydroxide-simeth (MAALOX/MYLANTA) 200-200-20 MG/5ML suspension 30 mL  30 mL Oral Q4H PRN Derrill Center, NP      . ARIPiprazole (ABILIFY) tablet 5 mg  5 mg Oral Daily Merian Capron, MD      . FLUoxetine (PROZAC) capsule 10 mg  10 mg Oral Daily Elmarie Shiley A, NP      . magnesium hydroxide (MILK OF MAGNESIA) suspension 30 mL  30 mL Oral Daily PRN Derrill Center, NP      . traZODone (DESYREL) tablet 50 mg  50 mg Oral QHS PRN Derrill Center, NP        PTA Medications: No prescriptions prior to admission.    Patient Stressors: Financial difficulties Loss of Job  Patient Strengths: Capable of independent living Curator fund of knowledge  Treatment Modalities: Medication Management, Group therapy, Case management,  1 to 1 session with clinician, Psychoeducation, Recreational therapy.   Physician Treatment Plan for Primary Diagnosis: Schizophrenia ([place]) Long Term Goal(s): Improvement in symptoms so as ready for discharge  Short Term Goals: Ability to identify changes in lifestyle to reduce recurrence of condition will improve Ability to demonstrate self-control will improve Ability to identify and develop effective coping behaviors will improve Ability to maintain clinical measurements within normal limits will improve Compliance with prescribed medications will improve Ability to identify changes in lifestyle to reduce recurrence of condition will improve  Medication Management: Evaluate patient's  response, side effects, and tolerance of medication regimen.  Therapeutic Interventions: 1 to 1 sessions, Unit Group sessions and Medication administration.  Evaluation of Outcomes: Not Progressing  Physician Treatment Plan for Secondary Diagnosis: Principal Problem:   Schizophrenia ([place])   Long Term Goal(s): Improvement in symptoms so as ready for discharge  Short Term Goals: Ability to identify changes in lifestyle to reduce recurrence of condition will improve Ability to demonstrate self-control will improve Ability to identify and develop effective coping behaviors will improve Ability to maintain clinical measurements within normal limits will improve Compliance with prescribed medications will improve Ability to identify changes in lifestyle to reduce recurrence of condition will improve  Medication Management: Evaluate patient's response, side effects, and tolerance of medication regimen.  Therapeutic Interventions: 1 to 1 sessions, Unit Group sessions and Medication administration.  Evaluation of Outcomes: Not Progressing   RN Treatment Plan for Primary Diagnosis: Schizophrenia ([place]) Long Term Goal(s): Knowledge of disease and therapeutic regimen to maintain health will improve  Short Term Goals: Ability to participate in decision making will improve and Compliance with prescribed medications will improve  Medication Management: RN will administer medications as ordered by provider, will assess and evaluate patient's response and provide education to patient for prescribed medication. RN will report any adverse and/or side effects to prescribing provider.  Therapeutic Interventions: 1 on 1 counseling sessions, Psychoeducation, Medication administration, Evaluate responses to treatment, Monitor vital signs and CBGs as ordered, Perform/monitor CIWA, COWS, AIMS and Fall Risk screenings as ordered, Perform wound care treatments as ordered.  Evaluation of Outcomes: Progressing     Recreational Therapy Treatment Plan  for Primary Diagnosis: Schizophrenia ([place]) Long Term Goal(s): Patient will participate in recreation therapy treatment in at least 2 group sessions without prompting from LRT  Short Term Goals: Patient will demonstrate increased ability to follow instructions, as demonstrated by ability to follow LRT instructions on first prompt during recreation therapy group sessions  Treatment Modalities: Group and Pet Therapy  Therapeutic Interventions: Psychoeducation  Evaluation of Outcomes: Progressing   LCSW Treatment Plan for Primary Diagnosis: Schizophrenia ([place]) Long Term Goal(s): Safe transition to appropriate next level of care at discharge, Engage patient in therapeutic group addressing interpersonal concerns.  Short Term Goals: Engage patient in aftercare planning with referrals and resources  Therapeutic Interventions: Assess for all discharge needs, 1 to 1 time with Social worker, Explore available resources and support systems, Assess for adequacy in community support network, Educate family and significant other(s) on suicide prevention, Complete Psychosocial Assessment, Interpersonal group therapy.  Evaluation of Outcomes: Not Met Disposition plan unknown, Patient currently refusing aftercare, reporting homelessness and may need a referral for a shelter.  Will continue to engage patient and assist with planning.  Patient continues to be limited in participation and leaves group quickly.  Does not want to engage, but does show up for group. He is prompted, but provides limited insight.  Progress in Treatment: Attending groups: Yes Participating in groups: Yes Taking medication as prescribed: Yes Toleration medication: Yes, no side effects reported at this time Family/Significant other contact made:  Patient understands diagnosis: Yes AEB Discussing patient identified problems/goals with staff: Yes Medical problems stabilized or resolved:  Yes Denies suicidal/homicidal ideation: Yes Issues/concerns per patient self-inventory: None Other: N/A  New problem(s) identified: Pt refused to sign treatment team note  New Short Term/Long Term Goal(s):"I don't need to be here. My goal is to get out of here.  I am ready to go. G.O."  Discharge Plan or Barriers:  Engagement  Reason for Continuation of Hospitalization:  Homicidal ideation Medication stabilization   Estimated Length of Stay: 5 days  Attendees: Patient: Marc Summers to sign 11/01/2016  2:40 PM  Physician: Ursula Alert, MD 11/01/2016  2:40 PM  Nursing: Grayland Ormond, RN 11/01/2016  2:40 PM  RN Care Manager: Lars Pinks, RN 11/01/2016  2:40 PM  Social Worker: Ripley Fraise 11/01/2016  2:40 PM  Recreational Therapist: Victorino Sparrow, LRT/CTRS 11/01/2016  2:40 PM  Other: Norberto Sorenson 11/01/2016  2:40 PM  Other:  11/01/2016  2:40 PM    Scribe for Treatment Team:  Roque Lias LCSW 11/01/2016 2:40 PM

## 2016-11-01 NOTE — Progress Notes (Signed)
D:  Marc Summers was in the day room much of the evening.  He denied SI/HI or A/V hallucinations.  He denied any pain or discomfort and appeared to be in no physical distress.  He attended group and reported that he was able to complete his goal for today.  He declined needing any medication and stated "I'm fine and I don't need anything at this time."  A:  Q 15 minute checks maintained for safety.  Support and encouragement provided.  R:  Marc Summers remains safe on the unit.  We will continue to monitor the progress towards his goals.

## 2016-11-01 NOTE — Progress Notes (Signed)
Recreation Therapy Notes  Date: 11/01/16 Time: 1000 Location: 500 Hall Dayroom  Group Topic: Communication, Team Building, Problem Solving  Goal Area(s) Addresses:  Patient will effectively work with peer towards shared goal.  Patient will identify skill used to make activity successful.  Patient will identify how skills used during activity can be used to reach post d/c goals.   Behavioral Response: Minimal  Intervention: STEM Activity   Activity: [place] [employer]. Patients were provided the following materials: 5 drinking straws, 5 rubber bands, 5 paper clips, 2 index cards, 2 drinking cups, and 2 toilet paper rolls. Using the provided materials patients were asked to build a launching mechanisms to launch a ping pong ball approximately 12 feet. Patients were divided into teams of 3-5.   Education: Pharmacist, communityocial Skills, Building control surveyorDischarge Planning.   Education Outcome: Acknowledges education/In group clarification offered/Needs additional education.   Clinical Observations/Feedback: Pt started off trying to help his team figure out how to build the launcher.  As group progressed, pt became more observant and less hands on.   Caroll RancherMarjette Neyra Pettie, LRT/CTRS         Caroll RancherLindsay, Sricharan Lacomb A 11/01/2016 11:43 AM

## 2016-11-02 NOTE — Progress Notes (Signed)
Adult Psychoeducational Group Note  Date:  11/02/2016 Time:  9:06 PM  Group Topic/Focus:  Wrap-Up Group:   The focus of this group is to help patients review their daily goal of treatment and discuss progress on daily workbooks.  Participation Level:  Minimal  Participation Quality:  Appropriate  Affect:  Flat  Cognitive:  Oriented  Insight: Good  Engagement in Group:  Engaged  Modes of Intervention:  Socialization and Support  Additional Comments:  Patient attended and participated in group tonight. He reports having a great day. He had conversation with his peers, went for his meals and attended group.  Lita MainsFrancis, Will Schier [facility] 11/02/2016, 9:06 PM

## 2016-11-02 NOTE — Progress Notes (Signed)
Continues to refuse meds, denies all symptoms and reports that he has no idea why he is here. Present in the milieu, interacting with other patients. Pleasant, cooperative.

## 2016-11-02 NOTE — Progress Notes (Signed)
D:  Marc HillDonald is up and visible on the unit.  He was pleasant and cooperative.  He denies any complaints at this time and appears to be in no physical distress.  He denies SI/HI or A/V hallucinations.  He is interacting with peers and staff.  He attended evening wrap up group. He continues to deny needing medications and is questioning when he will be leaving.  A:  Q15 minute checks maintained for safety.  Support and encouragement provided.  R:  [name] remains safe on the unit.  We will continue to monitor the progress towards his goals.

## 2016-11-02 NOTE — [facility] Group Notes (Signed)
[facility] Group Notes:  (Clinical Social Work)  11/02/2016  11:15-12:00PM  Summary of Progress/Problems:   Today's process group involved patients discussing their feelings related to being hospitalized, as well as benefits they see to being in the hospital.  These were itemized on the whiteboard, and then the group brainstormed specific ways in which they could seek for those same benefits to happen when they discharge and go back home. The patient expressed a primary feeling about being hospitalized is "no problem" and said his day so far has been "great."  He did not answer any further questions, i.e. About how to stay out of the hospital.  Type of Therapy:  Group Therapy - Process  Participation Level:  Minimal  Participation Quality:  Attentive  Affect:  Blunted  Cognitive:  Could not assess  Insight:  Limited  Engagement in Therapy:  Limited  Modes of Intervention:  Exploration, Discussion  Ambrose MantleMareida Grossman-Orr, LCSW 11/02/2016, 12:42 PM

## 2016-11-02 NOTE — Plan of Care (Signed)
Problem: Education: Goal: Will be free of psychotic symptoms Outcome: Progressing Pt denies any a/v hallucinations.  He denies that he has any issues at this time.

## 2016-11-02 NOTE — Progress Notes (Signed)
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  Objective: Geraldine Solaronald I [place] is awake, alert and oriented. Seen sitting in dayroom interacting with peers.  Reports he is feeling fine and states he keep getting placed here without any reason. Patient reports "they have false documentation on me"  Patient denies that he is prescribed and medications. Denies suicidal or homicidal ideation. Denies auditory or visual hallucination. Support, encouragement and reassurance was provided.   Principal Problem: Schizophrenia (HCC) ( per hx ) Diagnosis:   Patient Active Problem List   Diagnosis Date Noted  . Schizophrenia (HCC) [F20.9] 10/28/2016  . Anemia [D64.9] 06/22/2012  . Unresponsive episode [R41.89] 06/22/2012  . Acute encephalopathy [G93.40] 06/22/2012  . Hypokalemia [E87.6] 06/21/2012  . Hypothermia [T68.XXXA] 06/21/2012  . Delusional disorder(297.1) [F22] 04/10/2012  . Unspecified episodic mood disorder [F39] 04/10/2012   Total Time spent with patient: 15 minutes  Past Psychiatric History: Hospital admissions  Past Medical History:  Past Medical History:  Diagnosis Date  . Mental disorder     Past Surgical History:  Procedure Laterality Date  . rt foot surgery  2002  . rt kidney surgery     Family History: Please see H&P.  Family Psychiatric  History: mom: Schizophrenia  Social History:  History  Alcohol Use No     History  Drug Use  . Types: Marijuana    Comment: in high school    Social History   Social History  . Marital status: Married    Spouse name: N/A  . Number of children: N/A  . Years of education: N/A   Social History Main Topics  . Smoking status: Former Games developermoker  . Smokeless tobacco: Never Used  . Alcohol use No  . Drug use: Yes    Types: Marijuana     Comment: in high school  . Sexual  activity: No   Other Topics Concern  . None   Social History Narrative  . None   Additional Social History:    Pain Medications: Patient denies Prescriptions: Patient denies Over the Counter: Patient denies History of alcohol / drug use?: Yes Longest period of sobriety (when/how long): Since 2007 Name of Substance 1: Alcohol 1 - Age of First Use: Unknown 1 - Amount (size/oz): Unknow 1 - Frequency: Unknown 1 - Duration: Unknown 1 - Last Use / Amount: Unknown  Sleep: Fair  Appetite:  Fair  Current Medications: Current Facility-Administered Medications  Medication Dose Route Frequency Provider Last Rate Last Dose  . acetaminophen (TYLENOL) tablet 650 mg  650 mg Oral Q6H PRN Oneta RackLewis, Keyon Winnick N, NP      . alum & mag hydroxide-simeth (MAALOX/MYLANTA) 200-200-20 MG/5ML suspension 30 mL  30 mL Oral Q4H PRN Oneta RackLewis, Chike Farrington N, NP      . ARIPiprazole (ABILIFY) tablet 5 mg  5 mg Oral Daily Thresa RossAkhtar, Nadeem, MD      . FLUoxetine (PROZAC) capsule 10 mg  10 mg Oral Daily Fransisca Kaufmannavis, Laura A, NP      . magnesium hydroxide (MILK OF MAGNESIA) suspension 30 mL  30 mL Oral Daily PRN Oneta RackLewis, Nomar Broad N, NP      . traZODone (DESYREL) tablet 50 mg  50 mg Oral QHS PRN Oneta RackLewis, Dowell Hoon N, NP       Lab Results: No results found for this or  any previous visit (from the past 48 hour(s)).  Blood Alcohol level:  Lab Results  Component Value Date   ETH <5 10/25/2016   ETH <11 06/21/2012    Metabolic Disorder Labs: No results found for: HGBA1C, MPG No results found for: PROLACTIN No results found for: CHOL, TRIG, HDL, CHOLHDL, VLDL, LDLCALC  Physical Findings: AIMS: Facial and Oral Movements Muscles of Facial Expression: None, normal Lips and Perioral Area: None, normal Jaw: None, normal Tongue: None, normal,Extremity Movements Upper (arms, wrists, hands, fingers): None, normal Lower (legs, knees, ankles, toes): None, normal, Trunk Movements Neck, shoulders, hips: None, normal, Overall Severity Severity of  abnormal movements (highest score from questions above): None, normal Incapacitation due to abnormal movements: None, normal Patient's awareness of abnormal movements (rate only patient's report): No Awareness, Dental Status Current problems with teeth and/or dentures?: No Does patient usually wear dentures?: No  CIWA:  CIWA-Ar Total: 1 COWS:  COWS Total Score: 1  Musculoskeletal: Strength & Muscle Tone: within normal limits Gait & Station: normal Patient leans: N/A  Psychiatric Specialty Exam: Physical Exam  Nursing note and vitals reviewed. Constitutional: He is oriented to person, place, and time. He appears well-developed.  Cardiovascular: Normal rate.   Neurological: He is alert and oriented to person, place, and time.  Psychiatric: He has a normal mood and affect. His behavior is normal.    Review of Systems  Psychiatric/Behavioral: The patient is nervous/anxious.   All other systems reviewed and are negative.   Blood pressure 107/80, pulse (!) 56, temperature 97.9 F (36.6 C), temperature source Oral, resp. rate 20, height 5\' 9"  (1.753 m), weight 95.7 kg (211 lb).Body mass index is 31.16 kg/m.  General Appearance: Casual  Eye Contact:  Fair  Speech:  Normal Rate  Volume:  Normal  Mood:  Anxious   Affect:  Constricted  Thought Process: linear   Orientation:  Full (Time, Place, and Person)  Thought Content:  Paranoid Ideation  Suicidal Thoughts:  No  Homicidal Thoughts:  No   Memory:immediate - fair, recent - fair , remote - fair  Judgement:  Poor  Insight:  Shallow  Psychomotor Activity:  Decreased  Concentration: fair  Recall:  fair  Fund of Knowledge:  Fair  Language:  Fair  Akathisia:  Negative  Handed:  Right  AIMS (if indicated):     Assets:  Resilience Social Support  ADL's:  Intact  Cognition:  WNL  Sleep:  Number of Hours: 4.25    I agree with current treatment plan on 11/02/2016, Patient seen face-to-face for psychiatric evaluation follow-up,  chart reviewed. Reviewed the information documented and agree with the treatment plan.   Schizophrenia (HCC) Unstable  Will continue today 11/02/16  plan as below except where it is noted.  Treatment Plan Summary:  Daily contact with patient to assess and evaluate symptoms and progress in treatment, Medication management and Plan see below  No changes today - [place] continues to decline meds offered. Does not meet criteria for forced medications. Reviewed past medical records, treatment plan.  Pt declines medications, will offer PRN medications. Will continue to monitor vitals, medication compliance and treatment side effects while patient is here.  Will monitor for medical issues as well as call consult as needed.  CSW will continue to work on disposition.  Patient to participate in therapeutic milieu .   Oneta Rack, NP 11/02/2016, 10:08 AM

## 2016-11-03 MED ORDER — TRAZODONE HCL 50 MG PO TABS
50.0000 mg | ORAL_TABLET | Freq: Every evening | ORAL | Status: DC | PRN
  Filled 2016-11-03 (×2): qty 14
  Filled 2016-11-03: qty 1
  Filled 2016-11-03: qty 14
  Filled 2016-11-03: qty 1
  Filled 2016-11-03: qty 14
  Filled 2016-11-03 (×2): qty 1

## 2016-11-03 MED ORDER — ARIPIPRAZOLE 5 MG PO TABS: 5.0000 mg | ORAL_TABLET | Freq: Every day | ORAL | 0 refills | Status: DC

## 2016-11-03 MED ORDER — ARIPIPRAZOLE 5 MG PO TABS
5.0000 mg | ORAL_TABLET | Freq: Every day | ORAL | Status: DC
  Filled 2016-11-03: qty 1
  Filled 2016-11-03: qty 7
  Filled 2016-11-03: qty 1
  Filled 2016-11-03: qty 7
  Filled 2016-11-03: qty 1

## 2016-11-03 MED ORDER — FLUOXETINE HCL 10 MG PO CAPS: 10.0000 mg | ORAL_CAPSULE | Freq: Every day | ORAL | 0 refills | Status: DC

## 2016-11-03 MED ORDER — TRAZODONE HCL 50 MG PO TABS: 50.0000 mg | ORAL_TABLET | Freq: Every evening | ORAL | 0 refills | Status: DC | PRN

## 2016-11-03 MED ORDER — FLUOXETINE HCL 10 MG PO CAPS
10.0000 mg | ORAL_CAPSULE | Freq: Every day | ORAL | Status: DC
  Filled 2016-11-03: qty 7
  Filled 2016-11-03 (×3): qty 1
  Filled 2016-11-03: qty 7

## 2016-11-03 NOTE — Progress Notes (Signed)
Psychoeducational Group Note  Date:  11/03/2016 Time:  2039  Group Topic/Focus:  Wrap-Up Group:   The focus of this group is to help patients review their daily goal of treatment and discuss progress on daily workbooks.  Participation Level: Did Not Attend  Participation Quality:  Not Applicable  Affect:  Not Applicable  Cognitive:  Not Applicable  Insight:  Not Applicable  Engagement in Group: Not Applicable  Additional Comments:  The patient did not attend group.  Hazle CocaGOODMAN, Rodnesha Elie S 11/03/2016, 8:38 PM

## 2016-11-03 NOTE — [facility] Group Notes (Signed)
[facility] Group Notes:  (Clinical Social Work)  11/03/2016  11:00AM-12:00PM  Summary of Progress/Problems:  The main focus of today's process group was to listen to a variety of genres of music and to identify that different types of music provoke different responses.  The patient then was able to identify personally what was soothing for them, as well as energizing, as well as how patient can personally use this knowledge in sleep habits, with depression, and with other symptoms.  The patient expressed at the beginning of group the overall feeling of "great" and stated he did not listen to the music, did not respond to any of it.  After group he was asked about discharge plans because it was being discussed as possible for today.  He refused to discuss anything about where he would live and stated he would not follow up anywhere either.  As a result, he was told that he could not discharge.  Type of Therapy:  Music Therapy   Participation Level:  Minimal  Participation Quality:  Inattentive  Affect:  Blunted  Cognitive:  Could not assess  Insight:  None  Engagement in Therapy:  None  Modes of Intervention:   Activity, Exploration  Ambrose MantleMareida Grossman-Orr, LCSW 11/03/2016

## 2016-11-03 NOTE — Plan of Care (Signed)
Problem: Education: Goal: Will be free of psychotic symptoms Outcome: Progressing Denies all symptoms of psychosis Goal: Knowledge of the prescribed therapeutic regimen will improve Outcome: Not Progressing Refuses all medications ordered  Problem: Coping: Goal: Ability to cope will improve Outcome: Progressing Behavior has been calm and appropriate  Problem: Health Behavior/Discharge Planning: Goal: Compliance with prescribed medication regimen will improve Outcome: Not Progressing Refusing all medications  Problem: Role Relationship: Goal: Ability to interact with others will improve Outcome: Progressing Social and interactive with peers  Problem: Activity: Goal: Imbalance in normal sleep/wake cycle will improve Outcome: Progressing Awake during the day and sleeping well at night

## 2016-11-03 NOTE — Plan of Care (Signed)
Problem: Activity: Goal: Will verbalize the importance of balancing activity with adequate rest periods Outcome: Progressing Awake in milieu and participates in unit activities

## 2016-11-03 NOTE — Discharge Summary (Signed)
Physician Discharge Summary Note  Patient:  Marc Summers is an 56 y.o., male MRN:  [id number] DOB:  May 29, 1960 Patient phone:  [phone] (home)  Patient address:   [address] [place] [address],  Total Time spent with patient: 30 minutes  Date of Admission:  10/25/2016 Date of Discharge: 11/03/2016  Reason for Admission:  Per HPI-Marc Summers an 56 y.o.singele male, brought into [facility], by law enforcement, after IVC paperwork was completed by a family member, Marc Summers on 10/25/2014 with the [facility]. Patient was responsive, however responded with "no" to most of the questions. Patient appeared to be irritable and avoided eye contact and crossedhis arms when communicating with Clinical research associate. Patient denies, SI/HI/AVH or access to weapons.  Per IVC 10/24/2016 [facility]): Patient's IVC paperwork was completed by a family member. Patient's IVC paperwork statedPatient lost his job, resulting in the lost of 3 of his homes and all of his vechicles. Patient states thathe has plans to burn down a home that he previously owned. Patient has written out a list of people that he feels have done him wrong in the past. Patient has also written notes about murder, rape, amputations, and terrorism. Patient has a history of inpatient treatment for alcohol and drug use.Patient has been hospitalized "several years ago" at [facility] due to mental illness. Patient has a history of alcohol and drug abuse.   Per Medical records, Patient received inpatient treatment at [facility] on 07/2008 and 03/2012 for delusional disorder. Patient was seen at [facility], 01/2011 for Homicidal Ideations. Patient reports currently not receiving services from any provider.   Per psychiatric assessment on the morning of 10/26/2016:  Patient agreed to speak with this Provider but would not provide any details of his past history. Patient denied ever having been  admitted to a psychiatric hospital. He would reply to questions "Ask they people who put me here all these questions. Because I have no idea. I was minding my own business when the police picked me up." The patient denies ever having taken any psychiatric medications previously. The patient is observed sitting in the dayroom calmly watching TV. His mood appears dysphoric during interaction with Providers.  Principal Problem: Schizophrenia [facility]) Discharge Diagnoses: Patient Active Problem List   Diagnosis Date Noted  . Schizophrenia (HCC) [F20.9] 10/28/2016  . Anemia [D64.9] 06/22/2012  . Unresponsive episode [R41.89] 06/22/2012  . Acute encephalopathy [G93.40] 06/22/2012  . Hypokalemia [E87.6] 06/21/2012  . Hypothermia [T68.XXXA] 06/21/2012  . Delusional disorder(297.1) [F22] 04/10/2012  . Unspecified episodic mood disorder [F39] 04/10/2012    Past Psychiatric History:  Past Medical History:  Past Medical History:  Diagnosis Date  . Mental disorder     Past Surgical History:  Procedure Laterality Date  . rt foot surgery  2002  . rt kidney surgery     Family History: History reviewed. No pertinent family history. Family Psychiatric  History:  Social History:  History  Alcohol Use No     History  Drug Use  . Types: Marijuana    Comment: in high school    Social History   Social History  . Marital status: Married    Spouse name: N/A  . Number of children: N/A  . Years of education: N/A   Social History Main Topics  . Smoking status: Former Games developer  . Smokeless tobacco: Never Used  . Alcohol use No  . Drug use: Yes    Types: Marijuana     Comment:  in high school  . Sexual activity: No   Other Topics Concern  . None   Social History Narrative  . None    Hospital Course:  Marc Summers was admitted for Schizophrenia [facility]) and crisis management.  Pt was treated discharged with the medications listed below under Medication List.  Medical problems were  identified and treated as needed.  Home medications were restarted as appropriate.  Improvement was monitored by observation and Marc Summers 's daily report of symptom reduction.  Emotional and mental status was monitored by daily self-inventory reports completed by Marc Summers and clinical staff.         Marc Summers was evaluated by the treatment team for stability and plans for continued recovery upon discharge. Marc Summers was an integral factor for scheduling further treatment. Employment, transportation, bed availability, health status, family support, and any pending legal issues were also considered during hospital stay. Pt was offered further treatment options upon discharge including but not limited to Residential, Intensive Outpatient, and Outpatient treatment.  Marc Summers will follow up with the services as listed below under Follow Up Information.     Upon completion of this admission the patient was both mentally and medically stable for discharge denying suicidal/homicidal ideation, auditory/visual/tactile hallucinations, delusional thoughts and paranoia.    Marc Summers responded well to treatment with Individual and Group session patient is prescribed Abilify 5 mg and Prozac 10mg  however has declined  to take medications at this time. Pt demonstrated improvement without reported or observed adverse effects to the point of stability appropriate for outpatient management. Pertinent labs include: Glucose was elevated and  for which outpatient follow-up is necessary for lab recheck as mentioned below. Reviewed CBC, CMP, BAL, and UDS; all unremarkable aside from noted exceptions.   Physical Findings: AIMS: Facial and Oral Movements Muscles of Facial Expression: None, normal Lips and Perioral Area: None, normal Jaw: None, normal Tongue: None, normal,Extremity Movements Upper (arms, wrists, hands, fingers): None, normal Lower (legs, knees, ankles,  toes): None, normal, Trunk Movements Neck, shoulders, hips: None, normal, Overall Severity Severity of abnormal movements (highest score from questions above): None, normal Incapacitation due to abnormal movements: None, normal Patient's awareness of abnormal movements (rate only patient's report): No Awareness, Dental Status Current problems with teeth and/or dentures?: No Does patient usually wear dentures?: No  CIWA:  CIWA-Ar Total: 1 COWS:  COWS Total Score: 1  Musculoskeletal: Strength & Muscle Tone: within normal limits Gait & Station: normal Patient leans: N/A  Psychiatric Specialty Exam: See SRA by MD Physical Exam  Nursing note and vitals reviewed. Constitutional: He is oriented to person, place, and time. He appears well-developed.  Cardiovascular: Normal rate.   Neurological: He is alert and oriented to person, place, and time.  Psychiatric: He has a normal mood and affect. His behavior is normal.    Review of Systems  Psychiatric/Behavioral: Negative for depression (stable). The patient is not nervous/anxious (stable).     Blood pressure 118/75, pulse 63, temperature 98.5 F (36.9 C), temperature source Oral, resp. rate 20, height 5\' 9"  (1.753 m), weight 95.7 kg (211 lb).Body mass index is 31.16 kg/m.   Have you used any form of tobacco in the last 30 days? (Cigarettes, Smokeless Tobacco, Cigars, and/or Pipes): No  Has this patient used any form of tobacco in the last 30 days? (Cigarettes, Smokeless Tobacco, Cigars, and/or Pipes) Yes, Yes, A prescription for an FDA-approved tobacco cessation medication was offered  at discharge and the patient refused  Blood Alcohol level:  Lab Results  Component Value Date   [facility] <5 10/25/2016   ETH <11 06/21/2012    Metabolic Disorder Labs:  No results found for: HGBA1C, MPG No results found for: PROLACTIN No results found for: CHOL, TRIG, HDL, CHOLHDL, VLDL, LDLCALC  See Psychiatric Specialty Exam and Suicide Risk Assessment  completed by Attending Physician prior to discharge.  Discharge destination:  Home  Is patient on multiple antipsychotic therapies at discharge:  No   Has Patient had three or more failed trials of antipsychotic monotherapy by history:  No  Recommended Plan for Multiple Antipsychotic Therapies: NA  Discharge Instructions    Diet - low sodium heart healthy    Complete by:  As directed    Discharge instructions    Complete by:  As directed    Take all medications as prescribed. Keep all follow-up appointments as scheduled.  Do not consume alcohol or use illegal drugs while on prescription medications. Report any adverse effects from your medications to your primary care provider promptly.  In the event of recurrent symptoms or worsening symptoms, call 911, a crisis hotline, or go to the nearest emergency department for evaluation.   Increase activity slowly    Complete by:  As directed      Allergies as of 11/03/2016   No Known Allergies     Medication List    TAKE these medications     Indication  ARIPiprazole 5 MG tablet Commonly known as:  ABILIFY Take 1 tablet (5 mg total) by mouth daily. Start taking on:  11/04/2016  Indication:  Major Depressive Disorder   FLUoxetine 10 MG capsule Commonly known as:  PROZAC Take 1 capsule (10 mg total) by mouth daily. Start taking on:  11/04/2016  Indication:  Major Depressive Disorder   traZODone 50 MG tablet Commonly known as:  DESYREL Take 1 tablet (50 mg total) by mouth at bedtime as needed for sleep.  Indication:  Trouble Sleeping      Follow-up Information    Pt declines referrals for mental health follow-up Follow up.           Follow-up recommendations:  Activity:  as toleated Diet:  heart healthy  Comments:  Take all medications as prescribed. Keep all follow-up appointments as scheduled.  Do not consume alcohol or use illegal drugs while on prescription medications. Report any adverse effects from your  medications to your primary care provider promptly.  In the event of recurrent symptoms or worsening symptoms, call 911, a crisis hotline, or go to the nearest emergency department for evaluation.   Signed: Oneta Rackanika N Laketta Soderberg, NP 11/04/2016, 8:44 AM

## 2016-11-03 NOTE — Progress Notes (Signed)
[facility] MD Progress Note  11/03/2016 11:35 AM Marc Summers  MRN:  [id number]  Subjective: Patient reports " Feeling the same as yesterday."  Objective: Marc Summers seen sitting in dayroom interacting with peers. Patient reports he lives in [place] and is ready to get back home. Patient reports he has been unable to get in touch with family members at this time.  Per staffing notes patient continues to refuse mediations. Per treatment team patient was reccommended for weekend discharge. MD is requesting family meeting prior to discharge. Social worker to follow-up.  Patient continues to denies suicidal or homicidal ideation during this assessment. Patient denies depression, auditory or visual hallucination. Support, encouragement and reassurance was provided.   Principal Problem: Schizophrenia (HCC) ( per hx ) Diagnosis:   Patient Active Problem List   Diagnosis Date Noted  . Schizophrenia (HCC) [F20.9] 10/28/2016  . Anemia [D64.9] 06/22/2012  . Unresponsive episode [R41.89] 06/22/2012  . Acute encephalopathy [G93.40] 06/22/2012  . Hypokalemia [E87.6] 06/21/2012  . Hypothermia [T68.XXXA] 06/21/2012  . Delusional disorder(297.1) [F22] 04/10/2012  . Unspecified episodic mood disorder [F39] 04/10/2012   Total Time spent with patient: 15 minutes  Past Psychiatric History: Hospital admissions  Past Medical History:  Past Medical History:  Diagnosis Date  . Mental disorder     Past Surgical History:  Procedure Laterality Date  . rt foot surgery  2002  . rt kidney surgery     Family History: Please see H&P.  Family Psychiatric  History: mom: Schizophrenia  Social History:  History  Alcohol Use No     History  Drug Use  . Types: Marijuana    Comment: in high school    Social History   Social History  . Marital status: Married    Spouse name: N/A  . Number of children: N/A  . Years of education: N/A   Social History Main Topics  . Smoking status: Former Games developer   . Smokeless tobacco: Never Used  . Alcohol use No  . Drug use: Yes    Types: Marijuana     Comment: in high school  . Sexual activity: No   Other Topics Concern  . None   Social History Narrative  . None   Additional Social History:    Pain Medications: Patient denies Prescriptions: Patient denies Over the Counter: Patient denies History of alcohol / drug use?: Yes Longest period of sobriety (when/how long): Since 2007 Name of Substance 1: Alcohol 1 - Age of First Use: Unknown 1 - Amount (size/oz): Unknow 1 - Frequency: Unknown 1 - Duration: Unknown 1 - Last Use / Amount: Unknown  Sleep: Fair  Appetite:  Fair  Current Medications: Current Facility-Administered Medications  Medication Dose Route Frequency Provider Last Rate Last Dose  . acetaminophen (TYLENOL) tablet 650 mg  650 mg Oral Q6H PRN Oneta Rack, NP      . alum & mag hydroxide-simeth (MAALOX/MYLANTA) 200-200-20 MG/5ML suspension 30 mL  30 mL Oral Q4H PRN Oneta Rack, NP      . magnesium hydroxide (MILK OF MAGNESIA) suspension 30 mL  30 mL Oral Daily PRN Oneta Rack, NP       Lab Results: No results found for this or any previous visit (from the past 48 hour(s)).  Blood Alcohol level:  Lab Results  Component Value Date   [facility] <5 10/25/2016   ETH <11 06/21/2012    Metabolic Disorder Labs: No results found for: HGBA1C, MPG No results found for: PROLACTIN No  results found for: CHOL, TRIG, HDL, CHOLHDL, VLDL, LDLCALC  Physical Findings: AIMS: Facial and Oral Movements Muscles of Facial Expression: None, normal Lips and Perioral Area: None, normal Jaw: None, normal Tongue: None, normal,Extremity Movements Upper (arms, wrists, hands, fingers): None, normal Lower (legs, knees, ankles, toes): None, normal, Trunk Movements Neck, shoulders, hips: None, normal, Overall Severity Severity of abnormal movements (highest score from questions above): None, normal Incapacitation due to abnormal  movements: None, normal Patient's awareness of abnormal movements (rate only patient's report): No Awareness, Dental Status Current problems with teeth and/or dentures?: No Does patient usually wear dentures?: No  CIWA:  CIWA-Ar Total: 1 COWS:  COWS Total Score: 1  Musculoskeletal: Strength & Muscle Tone: within normal limits Gait & Station: normal Patient leans: N/A  Psychiatric Specialty Exam: Physical Exam  Nursing note and vitals reviewed. Constitutional: He appears well-developed.  Cardiovascular: Normal rate.   Neurological: He is alert.  Psychiatric: He has a normal mood and affect. His behavior is normal.    Review of Systems  Psychiatric/Behavioral: The patient is nervous/anxious.   All other systems reviewed and are negative.   Blood pressure 118/75, pulse 63, temperature 98.5 F (36.9 C), temperature source Oral, resp. rate 20, height 5\' 9"  (1.753 m), weight 95.7 kg (211 lb).Body mass index is 31.16 kg/m.  General Appearance: Casual, paper scrubs  Eye Contact:  Fair  Speech:  Normal Rate  Volume:  Normal  Mood:  Anxious   Affect:  Constricted  Thought Process: linear   Orientation:  Full (Time, Place, and Person)  Thought Content:  Paranoid Ideation  Suicidal Thoughts:  No  Homicidal Thoughts:  No   Memory:immediate - fair, recent - fair , remote - fair  Judgement:  Poor  Insight:  Shallow  Psychomotor Activity:  Decreased  Concentration: fair  Recall:  fair  Fund of Knowledge:  Fair  Language:  Fair  Akathisia:  Negative  Handed:  Right  AIMS (if indicated):     Assets:  Resilience Social Support  ADL's:  Intact  Cognition:  WNL  Sleep:  Number of Hours: 6    I agree with current treatment plan on 11/03/2016, Patient seen face-to-face for psychiatric evaluation follow-up, chart reviewed. Reviewed the information documented and agree with the treatment plan.  Will continue today 11/03/16  plan as below except where it is noted.  Treatment Plan  Summary:  Daily contact with patient to assess and evaluate symptoms and progress in treatment, Medication management and Plan see below  No changes today - Marc HillDonald continues to decline meds offered. Does not meet criteria for forced medications. Reviewed past medical records, treatment plan.  Pt declines medications, will offer PRN medications. Will continue to monitor vitals, medication compliance and treatment side effects while patient is here.  Will monitor for medical issues as well as call consult as needed.  CSW will continue to work on disposition.  Patient to participate in therapeutic milieu .   Oneta Rackanika N Lewis, NP 11/03/2016, 11:35 AM

## 2016-11-04 NOTE — Plan of Care (Signed)
Problem: [facility] Participation in Recreation Therapeutic Interventions Goal: STG-Patient will attend/participate in Rec Therapy Group Ses STG-The Patient will attend and participate in Recreation Therapy Group Sessions  Outcome: Completed/Met Date Met: 11/04/16 Pt attended communication, leisure education and coping skills recreation therapy sessions.  Victorino Sparrow, LRT/CTRS

## 2016-11-04 NOTE — Progress Notes (Signed)
Did not attend group 

## 2016-11-04 NOTE — [facility] Suicide Risk Assessment (Addendum)
[facility] Discharge Suicide Risk Assessment   Principal Problem: Schizophrenia [facility]) Discharge Diagnoses:  Patient Active Problem List   Diagnosis Date Noted  . Schizophrenia (HCC) [F20.9] 10/28/2016  . Anemia [D64.9] 06/22/2012  . Unresponsive episode [R41.89] 06/22/2012  . Acute encephalopathy [G93.40] 06/22/2012  . Hypokalemia [E87.6] 06/21/2012  . Hypothermia [T68.XXXA] 06/21/2012  . Delusional disorder(297.1) [F22] 04/10/2012  . Unspecified episodic mood disorder [F39] 04/10/2012  Patient is a 56 year old male transferred on IVC from [facility] ED by law enforcement for writing out that he planned to hurt people who had done him wrong, plan to burn down one of the house as he previously owned.  Patient states that he does not have access to guns, is upset because of his financial status changing, adds that when he gets frustrated, he writes down things and has no plans of harming anyone. Patient adds that he does not need psychotropic medication, is not suicidal, homicidal, is not hearing voices, does not feel that people are out to get him. Patient states that he wants to be discharged. He adds that he had discussed this with the psychiatrist over the weekend and does not see why he still in the hospital. He states that he is okay with seeing a therapist to help with his coping skills but does not feel he needs psychotropic medications. The Engineer, materials at [facility] to contact family and informed them of patient's discharge. Social worker working on Water engineer including duty to warn.  Total Time spent with patient: 30 minutes  Musculoskeletal: Strength & Muscle Tone: within normal limits Gait & Station: normal Patient leans: N/A  Psychiatric Specialty Exam: Review of Systems  Constitutional: Negative.  Negative for fever and malaise/fatigue.  HENT: Negative.  Negative for congestion, hearing loss and sore throat.   Eyes: Negative.  Negative for blurred vision and double vision.   Respiratory: Negative.  Negative for cough, sputum production, shortness of breath and wheezing.   Cardiovascular: Negative.  Negative for chest pain and palpitations.  Gastrointestinal: Negative.  Negative for abdominal pain, diarrhea, heartburn, nausea and vomiting.  Musculoskeletal: Negative.  Negative for falls and myalgias.  Neurological: Negative.  Negative for dizziness, seizures, loss of consciousness, weakness and headaches.  Endo/Heme/Allergies: Negative.  Negative for environmental allergies.  Psychiatric/Behavioral: Negative.  Negative for depression, hallucinations, memory loss, substance abuse and suicidal ideas. The patient is not nervous/anxious and does not have insomnia.     Blood pressure 117/76, pulse 61, temperature 98.5 F (36.9 C), resp. rate 18, height 5\' 9"  (1.753 m), weight 95.7 kg (211 lb).Body mass index is 31.16 kg/m.  General Appearance: Casual  Eye Contact::  Fair  Speech:  Clear and Coherent and Normal Rate409  Volume:  Normal  Mood:  Euthymic  Affect:  Congruent and Full Range  Thought Process:  Coherent and Descriptions of Associations: Intact  Orientation:  Full (Time, Place, and Person)  Thought Content:  WDL  Suicidal Thoughts:  No  Homicidal Thoughts:  No  Memory:  Immediate;   Fair Recent;   Fair Remote;   Fair  Judgement:  Impaired  Insight:  Lacking  Psychomotor Activity:  Normal  Concentration:  Fair  Recall:  [employer] of Knowledge:Fair  Language: Fair  Akathisia:  No  Handed:  Right  AIMS (if indicated):     Assets:  Housing Physical Health Social Support  Sleep:  Number of Hours: 6.25  Cognition: WNL  ADL's:  Intact   Mental Status Per Nursing Assessment::  On Admission:  NA  Demographic Factors:  Male, Low socioeconomic status and Unemployed  Loss Factors: NA  Historical Factors: Impulsivity  Risk Reduction Factors:   Religious beliefs about death and Living with another person, especially a relative  Continued  Clinical Symptoms:  More than one psychiatric diagnosis Previous Psychiatric Diagnoses and Treatments  Cognitive Features That Contribute To Risk:  Closed-mindedness    Suicide Risk:  Minimal: No identifiable suicidal ideation.  Patients presenting with no risk factors but with morbid ruminations; may be classified as minimal risk based on the severity of the depressive symptoms  Follow-up Information    Pt declines referrals for mental health follow-up Follow up.           Plan Of Care/Follow-up recommendations:  Activity:  As tolerated Diet:  Regular diet Other:  To keep follow-up appointments and take medications as prescribed. Patient has a history of noncompliance, does not feel he needs medications and is not a candidate for forced medications  Nelly RoutKUMAR,Jazelle Achey, MD 11/04/2016, 1:02 PM   Addendum Talk to [employer] criminal investigation unit officer along with Gonzella LexBrian Cheek, head of [facility] security, patient has no criminal history except for some traffic violations, has no access to weapons as per his daughter's report and has no history of violence. The officer states that he will transport patient back to [place] brother[place] house [place]. Discussed with the officer that patient is a paranoid schizophrenic, is not treatment compliant and needs to be on disability. Patient to be discharged on 11/05/2016

## 2016-11-04 NOTE — Social Work (Signed)
CSWs [place] and [place] spoke w Gonzella LexBrian Cheek, head of [facility].  Discussed situation and need to determine who to warn in order to exercise duty to warn.  Cheek states he will ask [employer] Criminal Investigation unit to contact daughter who has notebook detailing patient's statements of intent to harm "people who have done him wrong in the past."  Cheek will keep CSW advised of investigation and determination of intended targets and advise re next steps.  Santa GeneraAnne Brookelynn Hamor, LCSW Lead Clinical Social Worker Phone:  [phone]

## 2016-11-04 NOTE — Progress Notes (Signed)
D: patient isolative to his room and is irritable this shift. Patient states "I was supposed to be discharged this morning and they stopped it because I don't have a ride. I am a grown man and they can't hold me here against my will when they say I'm safe to go home". Patient remaining in his room sitting on the side of his bed in the dark staring at the wall. Patient declined medications. A: Encourage staff/peer interaction, medication compliance, and group participation. Administer medications as ordered, maintain Q 15 minute safety checks. R: Pt declined to go to groups and did not interact with peers in the milieu. Pt denies suicidal or homicidal ideations.

## 2016-11-04 NOTE — [facility] Group Notes (Signed)
[facility] LCSW Group Therapy  11/04/2016 4:28 PM  Type of Therapy:  Group Therapy  Participation Level:  Did Not Attend  Modes of Intervention:  Discussion, Education, Socialization and Support  Summary of Progress/Problems: Patients were encouraged to define what community means to them. They were encouraged to identify formal and informal support within their community.   Jayma Volpi L Breda Bond MSW, LCSW  11/04/2016, 4:28 PM   

## 2016-11-04 NOTE — Progress Notes (Signed)
Recreation Therapy Notes  INPATIENT RECREATION TR PLAN  Patient Details Name: Marc Summers MRN: [id number] DOB: [birth year] Today's Date: 11/04/2016  Rec Therapy Plan Is patient appropriate for Therapeutic Recreation?: Yes Treatment times per week: about 3 days Estimated Length of Stay: 5-7 days TR Treatment/Interventions: Group participation (Comment)  Discharge Criteria Pt will be discharged from therapy if:: Discharged Treatment plan/goals/alternatives discussed and agreed upon by:: Patient/family  Discharge Summary Short term goals set: Patient will attend and participate in Recreation Therapy Group Sessions  Short term goals met: Complete Progress toward goals comments: Groups attended Which groups?: Communication, Coping skills, Leisure education Reason goals not met: None Therapeutic equipment acquired: N/A Reason patient discharged from therapy: Discharge from hospital Pt/family agrees with progress & goals achieved: Yes Date patient discharged from therapy: 11/04/16   Victorino Sparrow, LRT/CTRS  Ria Comment, Janann Boeve A 11/04/2016, 11:59 AM

## 2016-11-04 NOTE — [facility] Counselor (Signed)
Pt refuses follow up with a mental health provider.   Rondall AllegraCandace L Ketra Duchesne MSW, LCSW  11/04/2016 4:37 PM

## 2016-11-04 NOTE — Progress Notes (Signed)
Recreation Therapy Notes  Date: 11/04/16 Time: 1000 Location: 500 Hall Dayroom  Group Topic: Coping Skills  Goal Area(s) Addresses:  Patients will be able to identify benefits of coping skills. Patients will be able to identify positive coping skills. Patients will be able to identify benefits of using coping skills post d/c.  Intervention: Pencils, worksheet, white board, dry erase marker, eraser  Activity: Mind map.  Patients were given a blank worksheet of a mind map.  Patients and LRT filled out the first eight boxes together with situations that would require the use of coping skills.  Patients came up with finances, death, misunderstandings, depression, body pain, anger, drug abuse and hygiene.  Patients were then given the opportunity to come up with three coping skills for each situation on their own before coming back together as a group to list coping skills on the board.  Education: PharmacologistCoping Skills, Building control surveyorDischarge Planning.   Education Outcome: Acknowledges understanding/In group clarification offered/Needs additional education.   Clinical Observations/Feedback: Pt did not attend group.   Caroll RancherMarjette Donnie Gedeon, LRT/CTRS         Caroll RancherLindsay, Hamdi Kley A 11/04/2016 11:39 AM

## 2016-11-04 NOTE — Progress Notes (Signed)
Dar Note: Patient is calm and appropriate on the unit.  Presents with flat affect and depressed mood.   Patient is visible in milieu interacting with peers.  No behavioral issues noted.  Refused morning medications after several attempt.  MD aware of refusal.  Routine safety checks maintained for safety.  Support and encouragement offered as needed.  Patient is safe on the unit.

## 2016-11-05 DIAGNOSIS — G4700 Insomnia, unspecified: Secondary | ICD-10-CM

## 2016-11-05 MED ORDER — TRAZODONE HCL 100 MG PO TABS: ORAL_TABLET | ORAL | 0 refills | Status: DC

## 2016-11-05 MED ORDER — TRAZODONE HCL 100 MG PO TABS
100.0000 mg | ORAL_TABLET | Freq: Every day | ORAL | Status: DC
  Filled 2016-11-05: qty 7

## 2016-11-05 MED ORDER — FLUOXETINE HCL 10 MG PO CAPS: 10.0000 mg | ORAL_CAPSULE | Freq: Every day | ORAL | 0 refills | Status: DC

## 2016-11-05 MED ORDER — ARIPIPRAZOLE 5 MG PO TABS: 5.0000 mg | ORAL_TABLET | Freq: Every day | ORAL | 0 refills | Status: DC

## 2016-11-05 NOTE — Progress Notes (Addendum)
D Note  In patient's chart  by LCSWA   [facility]( Candace Hyatt)  Insinuates duty to warn by  LCSWA  will not be carried out. Writer spoke  With [employer], who stated Dr. Asa SaunasKumaar along with [employer] and Security agree no need for Duty to Warn . DC paperwork prepared . Pt taken to search room and all belongings returned to pt. Pt refused to take part in dc teaching, education resolve and /or dc preparation. He refused to sign  [facility] ( that he is in receipt of dc paperwork and belongings returned to him ) . Pt given dc directions ( AVS, SRA, prescriptions, 2 bus tickets ) and escorted to bldg entrance. Pt refuses to take part in education, care plan resolution and / or suicde safety palnning.

## 2016-11-05 NOTE — Progress Notes (Signed)
  [facility] Discharge [facility] :  Will you be returning to the same living situation after discharge:  No. Pt [place] At discharge, do you have transportation home?: Yes,  Pt provided with taxi voucher Do you have the ability to pay for your medications: Yes,  Pt provided with prescriptions and samples  Release of information consent forms completed and in the chart;  Patient's signature needed at discharge.  Patient to Follow up at: Follow-up Information    Pt declines referrals for mental health follow-up Follow up.           Next level of care provider has access to [facility] Link:no  Safety Planning and Suicide Prevention discussed: Yes, with Pt; declines family contact  Have you used any form of tobacco in the last 30 days? (Cigarettes, Smokeless Tobacco, Cigars, and/or Pipes): No  Has patient been referred to the Quitline?: N/A patient is not a smoker  Patient has been referred for addiction treatment: Pt. refused referral  Verdene LennertLauren C Javar Eshbach 11/05/2016, 9:43 AM

## 2016-11-05 NOTE — Progress Notes (Signed)
   D: Pt was animated and pleasant during the assessment. When asked about his day pt stated, "great". Then asked the writer about her day. Pt informed the writer that he's scheduled to be discharged tomorrow. Writer asked pt about his plans after discharge. Pt stated, "I have a suit to take care of, and will do it when I get out". Pt denied SI, HI, A/V. Refused meds. Pt has no questions or concerns.    A:  Support and encouragement was offered. 15 min checks continued for safety.  R: Pt remains safe.

## 2016-11-05 NOTE — Discharge Summary (Signed)
Physician Discharge Summary Note  Patient:  Marc Summers is an 56 y.o., male MRN:  [id number] DOB:  December 18, 1960 Patient phone:  [phone] (home)  Patient address:   [address] [place] [address],  Total Time spent with patient: Greater than 30 minutes  Date of Admission:  10/25/2016 Date of Discharge: 11-05-16  Reason for Admission: IVC'ed for making what appeared like a hit list for people that he felt wronged him.   Principal Problem: Paranoid schizophrenia [facility])  Discharge Diagnoses: Patient Active Problem List   Diagnosis Date Noted  . Paranoid schizophrenia (HCC) [F20.0] 10/28/2016  . Anemia [D64.9] 06/22/2012  . Unresponsive episode [R41.89] 06/22/2012  . Acute encephalopathy [G93.40] 06/22/2012  . Hypokalemia [E87.6] 06/21/2012  . Hypothermia [T68.XXXA] 06/21/2012  . Delusional disorder(297.1) [F22] 04/10/2012  . Unspecified episodic mood disorder [F39] 04/10/2012   Past Psychiatric History: Paranoid Schizophrenia.  Past Medical History:  Past Medical History:  Diagnosis Date  . Mental disorder     Past Surgical History:  Procedure Laterality Date  . rt foot surgery  2002  . rt kidney surgery     Family History: History reviewed. No pertinent family history.  Family Psychiatric  History: See H&P  Social History:  History  Alcohol Use No     History  Drug Use  . Types: Marijuana    Comment: in high school    Social History   Social History  . Marital status: Married    Spouse name: N/A  . Number of children: N/A  . Years of education: N/A   Social History Main Topics  . Smoking status: Former Games developer  . Smokeless tobacco: Never Used  . Alcohol use No  . Drug use: Yes    Types: Marijuana     Comment: in high school  . Sexual activity: No   Other Topics Concern  . None   Social History Narrative  . None   Hospital Course: Marc Guillet Johnsonis an 56 y.o.singele male, brought to [facility], by law enforcement, after IVC paperwork was  completed by a family member, [name] on 10/25/2014 with the [facility]. Patient was responsive, however responded with "no" to most of the questions. Patient appeared to be irritable and avoided eye contact and crossedhis arms when communicating with Clinical research associate. Patient denies, SI/HI/AVH or access to weapons.   After the above admission assessment with the knowledge of his chronic mental illness (Paranoid Schizophrenia), Marc Summers was started on medication regimen for mood stabilization treatment. He was ordered Abilify 5 mg for mood control, Fluoxetine 10 mg for depression & Trazodone 100 mg for insomnia. However, Marc Summers refused to take any medications during his hospital stay. He stated each time that he was offered to take medication that he did not need any medications. He denied that there is anything wrong with him or his mind. He protested against his admission, noting that he did not do anything wrong to warrant this IVC admission.   During the course of his hospitalization, Marc Summers was visible on the unit during the day & in bed at night. He participated in the group counseling sessions being offered & held on this unit. He was present & particicpated in every activities held for recreational purposes. He learned coping skills. Hester most of the time, kept to himself.  No disruptive behavior displayed. He ate well & slept well without any complaints. On another note, he maintained a disheveled look thru discharge. And because he presented no behavioral issues during his  hospital stay, he was not a candidate for a forced medications recommendation or order.   Marc Summers is seen today by the attending psychiatrist.  He maintained that he is not feeling depressed. He maintained that he is not hearing voices. He maintained that he is not experiencing any delusional thinking. However, he remained paranoid of people, events & circumstances. Marc Summers describes normal energy. He is focused on  getting discharged. He denies suicidal thoughts, homicidal thoughts. He denies thoughts of violence. Patient reports normal biological functions.   Nursing staff reports that patient has been appropriate on the unit. He did not really interact with anyone. No behavioral issues. Patient has not voiced any suicidal thoughts.  Patient has not been adherent with treatment recommendations.   Patient was discussed at the team meeting this morning. Team members feels that patient is probably at his baseline level of function. Team agrees with plan to discharge patient today to continue mental health care on an outpatient basis. However, he declines outpatient referral & psychiatry services.  Upon discharge, Marc Summers adamantly denies any SIHI, AVH, delusional thoughts or paranoia. He is provided with prescriptions of all his ordered medications in the event that he changes his mind & decides to take medications. He declines  To sign his discharge AVS. As of note, Marc Summers has no access to weapons, has no history of violence, has had a few traffic violations and lives with his brother. The daughter is also trying to get disability for him & information about his diagnosis which is Paranoid Schizophrenia was given to daughter. [facility] in no apparent distress. Transportation per taxi. [facility] assisted with taxi Voucher.  Physical Findings: AIMS: Facial and Oral Movements Muscles of Facial Expression: None, normal Lips and Perioral Area: None, normal Jaw: None, normal Tongue: None, normal,Extremity Movements Upper (arms, wrists, hands, fingers): None, normal Lower (legs, knees, ankles, toes): None, normal, Trunk Movements Neck, shoulders, hips: None, normal, Overall Severity Severity of abnormal movements (highest score from questions above): None, normal Incapacitation due to abnormal movements: None, normal Patient's awareness of abnormal movements (rate only patient's report): No  Awareness, Dental Status Current problems with teeth and/or dentures?: No Does patient usually wear dentures?: No  CIWA:  CIWA-Ar Total: 1 COWS:  COWS Total Score: 1  Musculoskeletal: Strength & Muscle Tone: within normal limits Gait & Station: normal Patient leans: N/A  Psychiatric Specialty Exam: Physical Exam  Constitutional: He appears well-developed.  HENT:  Head: Normocephalic.  Eyes: Pupils are equal, round, and reactive to light.  Neck: Normal range of motion.  Cardiovascular: Normal rate.   Respiratory: Effort normal.  GI: Soft.  Genitourinary:  Genitourinary Comments: Deferred  Musculoskeletal: Normal range of motion.  Neurological: He is alert.  Skin: Skin is warm.    Review of Systems  Constitutional: Negative.   HENT: Negative.   Eyes: Negative.   Respiratory: Negative.   Cardiovascular: Negative.   Gastrointestinal: Negative.   Genitourinary: Negative.   Musculoskeletal: Negative.   Skin: Negative.   Neurological: Negative.   Endo/Heme/Allergies: Negative.   Psychiatric/Behavioral: Positive for depression (Stable) and hallucinations (Hx. Psychosis). Negative for memory loss, substance abuse and suicidal ideas. The patient has insomnia (Stable). The patient is not nervous/anxious.     Blood pressure 117/76, pulse 61, temperature 98.5 F (36.9 C), resp. rate 18, height 5\' 9"  (1.753 m), weight 95.7 kg (211 lb).Body mass index is 31.16 kg/m.  See Md's SRA   Have you used any form of tobacco in  the last 30 days? (Cigarettes, Smokeless Tobacco, Cigars, and/or Pipes): No  Has this patient used any form of tobacco in the last 30 days? (Cigarettes, Smokeless Tobacco, Cigars, and/or Pipes)No  Blood Alcohol level:  Lab Results  Component Value Date   ETH <5 10/25/2016   ETH <11 06/21/2012   Metabolic Disorder Labs:  No results found for: HGBA1C, MPG No results found for: PROLACTIN No results found for: CHOL, TRIG, HDL, CHOLHDL, VLDL, LDLCALC  See  Psychiatric Specialty Exam and Suicide Risk Assessment completed by Attending Physician prior to discharge.  Discharge destination:  Home  Is patient on multiple antipsychotic therapies at discharge:  No   Has Patient had three or more failed trials of antipsychotic monotherapy by history:  No  Recommended Plan for Multiple Antipsychotic Therapies: NA  Allergies as of 11/05/2016   No Known Allergies     Medication List    TAKE these medications     Indication  ARIPiprazole 5 MG tablet Commonly known as:  ABILIFY Take 1 tablet (5 mg total) by mouth daily. For mood control  Indication:  Mood control   FLUoxetine 10 MG capsule Commonly known as:  PROZAC Take 1 capsule (10 mg total) by mouth daily. For depression  Indication:  Major Depressive Disorder   traZODone 100 MG tablet Commonly known as:  DESYREL Take 1 tablet (100 mg) at bedtime: For sleep  Indication:  Trouble Sleeping      Follow-up Information    Pt declines referrals for mental health follow-up Follow up.          Follow-up recommendations: Activity:  As tolerated Diet: As recommended by your primary care doctor. Keep all scheduled follow-up appointments as recommended.   Comments: Patient is instructed prior to discharge to: Take all medications as prescribed by his/her mental healthcare provider. Report any adverse effects and or reactions from the medicines to his/her outpatient provider promptly. Patient has been instructed & cautioned: To not engage in alcohol and or illegal drug use while on prescription medicines. In the event of worsening symptoms, patient is instructed to call the crisis hotline, 911 and or go to the nearest ED for appropriate evaluation and treatment of symptoms. To follow-up with his/her primary care provider for your other medical issues, concerns and or health care needs.   Signed: Sanjuana Kava, NP, PMHNP, FNP-BC 11/05/2016, 11:04 AM

## 2016-11-05 NOTE — [facility] Counselor (Signed)
CSW, Dr. Lucianne MussKumar, Alita ChyleBenny Lucas and Gonzella LexBrian Cheek discussed duty to warn with [place] Numbers[employer]ive Eric Early [phone] via phone conference. Detective Early states pt does not have a violent criminal history. He states pt has a few traffic violations. Detective Early spoke with pt's daughter who reports pt does not access to weapons or means to access those he has threatened. Detective Early plans to follow up with patient upon discharge.   Daisy FloroCandace L Cyan Clippinger MSW, LCSW  11/05/2016 12:59 PM

## 2016-11-05 NOTE — [facility] Suicide Risk Assessment (Signed)
[facility] Discharge Suicide Risk Assessment   Principal Problem: Paranoid schizophrenia [facility]) Discharge Diagnoses:  Patient Active Problem List   Diagnosis Date Noted  . Paranoid schizophrenia (HCC) [F20.0] 10/28/2016    Priority: High  . Anemia [D64.9] 06/22/2012  . Unresponsive episode [R41.89] 06/22/2012  . Acute encephalopathy [G93.40] 06/22/2012  . Hypokalemia [E87.6] 06/21/2012  . Hypothermia [T68.XXXA] 06/21/2012  . Delusional disorder(297.1) [F22] 04/10/2012  . Unspecified episodic mood disorder [F39] 04/10/2012    Total Time spent with patient: 30 minutes  Musculoskeletal: Strength & Muscle Tone: within normal limits Gait & Station: normal Patient leans: N/A  Psychiatric Specialty Exam: Review of Systems  Constitutional: Negative.  Negative for fever and malaise/fatigue.  HENT: Negative.  Negative for congestion and sore throat.   Eyes: Negative.  Negative for blurred vision, double vision, discharge and redness.  Respiratory: Negative.  Negative for cough, shortness of breath and wheezing.   Cardiovascular: Negative.  Negative for chest pain and palpitations.  Gastrointestinal: Negative.  Negative for abdominal pain, constipation, diarrhea, heartburn, nausea and vomiting.  Musculoskeletal: Negative.  Negative for falls and myalgias.  Neurological: Negative.  Negative for dizziness, seizures, loss of consciousness, weakness and headaches.  Endo/Heme/Allergies: Negative.  Negative for environmental allergies.  Psychiatric/Behavioral: Negative for depression, hallucinations, memory loss, substance abuse and suicidal ideas. The patient is not nervous/anxious and does not have insomnia.     Blood pressure 117/76, pulse 61, temperature 98.5 F (36.9 C), resp. rate 18, height 5\' 9"  (1.753 m), weight 95.7 kg (211 lb).Body mass index is 31.16 kg/m.  General Appearance: Casual  Eye Contact::  Fair  Speech:  Clear and Coherent and Normal Rate409  Volume:  Normal  Mood:   Irritable  Affect:  Congruent and Full Range  Thought Process:  Coherent, Goal Directed and Descriptions of Associations: Intact  Orientation:  Full (Time, Place, and Person)  Thought Content:  Delusions  Suicidal Thoughts:  No  Homicidal Thoughts:  No  Memory:  Immediate;   Fair Recent;   Fair Remote;   Fair  Judgement:  Impaired  Insight:  Lacking  Psychomotor Activity:  Normal  Concentration:  Fair  Recall:  [employer] of Knowledge:Fair  Language: Fair  Akathisia:  No  Handed:  Right  AIMS (if indicated):     Assets:  Housing Physical Health Social Support  Sleep:  Number of Hours: 6  Cognition: WNL  ADL's:  Intact   Mental Status Per Nursing Assessment::   On Admission:  NA  Demographic Factors:  Male, Low socioeconomic status and Unemployed  Loss Factors: Financial problems/change in socioeconomic status  Historical Factors: NA  Risk Reduction Factors:   Living with another person, especially a relative and Positive social support  Continued Clinical Symptoms:  Previous Psychiatric Diagnoses and Treatments  Cognitive Features That Contribute To Risk:  Closed-mindedness    Suicide Risk:  Minimal: No identifiable suicidal ideation.  Patients presenting with no risk factors but with morbid ruminations; may be classified as minimal risk based on the severity of the depressive symptoms  Follow-up Information    Pt declines referrals for mental health follow-up Follow up.           Plan Of Care/Follow-up recommendations:  Activity:  As tolerated Diet:  Heart healthy diet Other:  To keep follow-up appointments and take medications as prescribed. Discussed in length the need for medication compliance with patient and patient still close to the idea of having a psychiatric diagnosis Please see suicide risk assessment completed  yesterday as [place] criminal Oncologistinvestigation unit officer has investigated the duty to warn as patient had written notes in a book  as per his daughter. Patient has no access to weapons, has no history of violence, has had a few traffic violations and lives with his brother. The daughter is also trying to get disability for patient and information about his diagnosis which is paranoid schizophrenia was given to daughter Nelly RoutKUMAR,Tery Hoeger, MD 11/05/2016, 1:06 PM

## 2016-11-05 NOTE — Progress Notes (Signed)
Recreation Therapy Notes  Date: 11/05/16 Time: 1000 Location: 500 Hall Dayroom    Group Topic: Wellness  Goal Area(s) Addresses:  Patient will define components of whole wellness. Patient will verbalize benefit of whole wellness.  Intervention:  2 Decks of cards   Activity: Deck of Chance.  Patients were given 2 cards from one deck of cards.  From a separate deck, LRT would pull a card from that deck.  Whatever card was pulled, the patient or patients with the same card would have to do the exercise that corresponds with that card.  Education: Wellness, Building control surveyorDischarge Planning.   Education Outcome: Acknowledges education/In group clarification offered/Needs additional education.   Clinical Observations/Feedback:  Pt did not attend group.   Caroll RancherMarjette Nicoli Nardozzi, LRT/CTRS         Lillia AbedLindsay, Breeanne Oblinger A 11/05/2016 11:35 AM

## 2020-10-23 ENCOUNTER — Other Ambulatory Visit: Payer: Self-pay

## 2020-10-23 ENCOUNTER — Ambulatory Visit: Admission: EM | Admit: 2020-10-23 | Discharge: 2020-10-23 | Discharge status: Absent without leave | Payer: Self-pay

## 2021-11-27 ENCOUNTER — Ambulatory Visit
Admission: EM | Admit: 2021-11-27 | Discharge: 2021-11-27 | Disposition: A | Discharge status: Home / Self Care | Payer: Self-pay

## 2021-11-27 ENCOUNTER — Encounter: Payer: Self-pay | Admitting: Emergency Medicine

## 2021-11-27 ENCOUNTER — Ambulatory Visit: Payer: Self-pay

## 2021-11-27 DIAGNOSIS — Z760 Encounter for issue of repeat prescription: Secondary | ICD-10-CM

## 2021-11-27 DIAGNOSIS — Z8639 Personal history of other endocrine, nutritional and metabolic disease: Secondary | ICD-10-CM

## 2021-11-27 DIAGNOSIS — Z8659 Personal history of other mental and behavioral disorders: Secondary | ICD-10-CM

## 2021-11-27 MED ORDER — VITAMIN D3 50 MCG (2000 UT) PO CAPS: 2000.0000 [IU] | ORAL_CAPSULE | Freq: Every day | ORAL | 0 refills | Status: AC

## 2021-11-27 MED ORDER — RISPERIDONE 2 MG PO TABS: 2.0000 mg | ORAL_TABLET | Freq: Every day | ORAL | 0 refills | Status: DC

## 2021-11-27 NOTE — Discharge Instructions (Addendum)
-   We will call you if the Vitamin D level is abnormal  - Please continue the medications as prescribed until you are able to follow up with your new PCP

## 2021-11-27 NOTE — ED Triage Notes (Signed)
Needs medication refill for:  Risperidone 2mg  and vitamin D3

## 2021-11-27 NOTE — ED Provider Notes (Signed)
[facility]    CSN: [id number] Arrival date & time: 11/27/21  0859      History   Chief Complaint No chief complaint on file.   HPI Marc Summers is a 61 y.o. male.   Patient presents for medication refill today.  He brings with him a blister pack of medications with 1 day supply left.  It appears this has been dispensed from [facility] and it appears the medication he currently takes in risperidone 2 mg nightly and Vitamin D3 2000 IU daily.  He tells me he does not know why he takes these medications, however does feel like they work well for him and his health has been stable since taking them.  He reports he has an appointment to establish care with a new primary care provider in the next month.    Today, he denies chest pain, shortness of breath, dizziness/lightheadedness, vision changes, and fatigue.    Medical history is significant for depression, anemia, paranoid schizophrenia, and delusional disorder.      Past Medical History:  Diagnosis Date   Mental disorder     Patient Active Problem List   Diagnosis Date Noted   Paranoid schizophrenia (HCC) 10/28/2016   Anemia 06/22/2012   Unresponsive episode 06/22/2012   Acute encephalopathy 06/22/2012   Hypokalemia 06/21/2012   Hypothermia 06/21/2012   Delusional disorder(297.1) 04/10/2012   Unspecified episodic mood disorder 04/10/2012    Past Surgical History:  Procedure Laterality Date   rt foot surgery  2002   rt kidney surgery         Home Medications    Prior to Admission medications   Medication Sig Start Date End Date Taking? Authorizing Provider  Cholecalciferol (VITAMIN D3) 50 MCG (2000 UT) capsule Take 1 capsule (2,000 Units total) by mouth daily. 11/27/21  Yes Valentino Nose, NP  risperiDONE (RISPERDAL) 2 MG tablet Take 1 tablet (2 mg total) by mouth at bedtime. 11/27/21 02/25/22  Valentino Nose, NP    Family History History reviewed. No pertinent family  history.  Social History Social History   Tobacco Use   Smoking status: Former   Smokeless tobacco: Never  Substance Use Topics   Alcohol use: No   Drug use: Yes    Types: Marijuana    Comment: in high school     Allergies   Patient has no known allergies.   Review of Systems Review of Systems Per HPI  Physical Exam Triage Vital Signs ED Triage Vitals  Enc Vitals Group     BP 11/27/21 0911 102/60     Pulse Rate 11/27/21 0911 65     Resp 11/27/21 0911 18     Temp 11/27/21 0911 98.1 F (36.7 C)     Temp Source 11/27/21 0911 Oral     SpO2 11/27/21 0911 97 %     Weight --      Height --      Head Circumference --      Peak Flow --      Pain Score 11/27/21 0912 0     Pain Loc --      Pain Edu? --      Excl. in GC? --    No data found.  Updated Vital Signs BP 102/60 (BP Location: Right Arm)   Pulse 65   Temp 98.1 F (36.7 C) (Oral)   Resp 18   SpO2 97%   Visual Acuity Right Eye Distance:   Left Eye Distance:  Bilateral Distance:    Right Eye Near:   Left Eye Near:    Bilateral Near:     Physical Exam Vitals and nursing note reviewed.  Constitutional:      General: He is not in acute distress.    Appearance: Normal appearance. He is not toxic-appearing.  HENT:     Mouth/Throat:     Mouth: Mucous membranes are moist.     Pharynx: Oropharynx is clear.  Cardiovascular:     Rate and Rhythm: Normal rate and regular rhythm.  Pulmonary:     Effort: Pulmonary effort is normal. No respiratory distress.     Breath sounds: Normal breath sounds. No wheezing, rhonchi or rales.  Skin:    General: Skin is warm and dry.     Capillary Refill: Capillary refill takes less than 2 seconds.     Coloration: Skin is not jaundiced or pale.     Findings: No erythema.  Neurological:     Mental Status: He is alert.  Psychiatric:        Behavior: Behavior is cooperative.      UC Treatments / Results  Labs (all labs ordered are listed, but only abnormal results  are displayed) Labs Reviewed  VITAMIN D 25 HYDROXY (VIT D DEFICIENCY, FRACTURES)    EKG   Radiology No results found.  Procedures Procedures (including critical care time)  Medications Ordered in UC Medications - No data to display  Initial Impression / Assessment and Plan / UC Course  I have reviewed the triage vital signs and the nursing notes.  Pertinent labs & imaging results that were available during my care of the patient were reviewed by me and considered in my medical decision making (see chart for details).    Patient is a 61 year old male presenting for medication refill today.  Will check Vitamin D level to ensure we are not over treating Vitamin D, otherwise continue Risperdal 2 mg and Vitamin D3 as previously prescribed and encouraged follow up with primary care provider.   The patient was given the opportunity to ask questions.  All questions answered to their satisfaction.  The patient is in agreement to this plan.   Final Clinical Impressions(s) / UC Diagnoses   Final diagnoses:  History of depression  History of vitamin D deficiency  Medication refill     Discharge Instructions      - We will call you if the Vitamin D level is abnormal  - Please continue the medications as prescribed until you are able to follow up with your new PCP     ED Prescriptions     Medication Sig Dispense Auth. Provider   risperiDONE (RISPERDAL) 2 MG tablet Take 1 tablet (2 mg total) by mouth at bedtime. 90 tablet Cathlean Marseilles A, NP   Cholecalciferol (VITAMIN D3) 50 MCG (2000 UT) capsule Take 1 capsule (2,000 Units total) by mouth daily. 90 capsule Valentino Nose, NP      PDMP not reviewed this encounter.   Valentino Nose, NP 11/27/21 1014

## 2021-11-28 LAB — VITAMIN D 25 HYDROXY (VIT D DEFICIENCY, FRACTURES): Vit D, 25-Hydroxy: 42.5 ng/mL (ref 30.0–100.0)

## 2022-03-05 ENCOUNTER — Telehealth: Payer: Self-pay

## 2022-03-05 NOTE — Telephone Encounter (Signed)
Called to follow up with client, he states he is doing well and he currently still has his medications and is doing "okay" as long as he has them. Discussed his appointment to establish primary care with Carin Primrose is on 03/12/22 at 16 for [place] intake then 03/19/22 for medical.  Client denies any needs today, states he has no food needs, housing is safe. He is agreeable to follow ups. Will follow up after his [facility] visits and will also refer to [facility] interns for further follow up.   [place] [employer]

## 2022-06-20 ENCOUNTER — Telehealth: Payer: Self-pay

## 2022-06-20 NOTE — Telephone Encounter (Signed)
Attempted call to [facility] client for check in call. Another male answered the phone stating he is not available to call back close to 5PM or tomorrow.  Plan: will plan to attempt to reach 06/21/22  [place]/[facility]

## 2022-06-21 ENCOUNTER — Telehealth: Payer: Self-pay

## 2022-06-21 NOTE — Telephone Encounter (Signed)
Attempted call for follow up of [facility] client, person answered stated client was there. Then the phone line stayed open and no one ever picked up or spoke, waited with open line for 10 mins, ended call. Will attempt to reconnect next work week.  [place] [employer]

## 2022-06-25 ENCOUNTER — Telehealth: Payer: Self-pay

## 2022-06-25 NOTE — Telephone Encounter (Signed)
Attempted to return call as client had called 06/24/22 while this RN was out of office. No answer, [facility] client's Voicemail box full and unable to leave a message.   Plan: call is for general follow up to assess for needs for resources and to assure he is aware of follow up appointment with [facility]. Will continue to attempt to reach.  [place] [employer]

## 2022-06-25 NOTE — Telephone Encounter (Signed)
Client returned call. He states he is doing well, He has no SDOH needs at this time. States he has all his medications, no food insecurities, no housing or utilities needs at this time. Reviewed his upcoming appointments with [facility], the next being 07/04/22.  Encouraged client to call [facility] if he needs any resources or has questions that we may help answer. He states understanding.  [place] [employer]

## 2022-06-27 ENCOUNTER — Telehealth: Payer: Self-pay

## 2022-06-27 NOTE — Telephone Encounter (Signed)
Returned call to client. He wanted to know the date and time of his next appointment with [facility]. Which is 07/04/22 at 1:45 PM. Client also gave verbal consent to text that appointment information to him. Text sent. No other needs at this time per client.   [facility]

## 2022-11-28 ENCOUNTER — Telehealth: Payer: Self-pay

## 2022-11-28 NOTE — Telephone Encounter (Signed)
Attempted a wellness/follow up call to [facility] client. NO answer, left voicemail requesting return call.  PCP: Nolon Stalls, next appointment 12/19/22   Will continue to attempt to reach.  Francee Nodal RN [employer]

## 2023-01-09 ENCOUNTER — Telehealth: Payer: Self-pay

## 2023-01-09 NOTE — Telephone Encounter (Signed)
Attempted wellness call with [facility] client. PCP [facility], next appointment 01/23/23. No answer today, left message.    Francee Nodal RN [employer]

## 2023-09-15 ENCOUNTER — Other Ambulatory Visit: Payer: Self-pay

## 2023-11-25 ENCOUNTER — Inpatient Hospital Stay: Payer: Self-pay

## 2023-11-25 ENCOUNTER — Inpatient Hospital Stay: Payer: Self-pay | Admitting: Hematology

## 2024-01-23 ENCOUNTER — Ambulatory Visit: Payer: Self-pay | Admitting: Physician Assistant

## 2024-05-11 ENCOUNTER — Ambulatory Visit (INDEPENDENT_AMBULATORY_CARE_PROVIDER_SITE_OTHER): Payer: Self-pay

## 2024-05-11 VITALS — BP 95/61 | HR 65 | Temp 97.9°F | Ht 69.0 in | Wt 200.0 lb

## 2024-05-11 DIAGNOSIS — Z7901 Long term (current) use of anticoagulants: Secondary | ICD-10-CM

## 2024-05-11 DIAGNOSIS — Z79899 Other long term (current) drug therapy: Secondary | ICD-10-CM

## 2024-05-11 DIAGNOSIS — Z1211 Encounter for screening for malignant neoplasm of colon: Secondary | ICD-10-CM

## 2024-05-11 DIAGNOSIS — F200 Paranoid schizophrenia: Secondary | ICD-10-CM | POA: Diagnosis not present

## 2024-05-11 MED ORDER — RISPERIDONE 2 MG PO TABS: 2.0000 mg | ORAL_TABLET | Freq: Every day | ORAL | 1 refills | Status: AC

## 2024-05-11 NOTE — Progress Notes (Signed)
 "  New Patient Office Visit  Subjective    Patient ID: Marc Summers, male    DOB: [birth year]  Age: 64 y.o. MRN: [id number]  HPI [name] presents to establish care.    Has a history of schizophrenia and was following up with behavioral health outpatient for this.  Would like to get primary care to manage his Risperdal  refills due to convenience as his behavioral health clinic is further away.  Patient is currently stable on his Risperdal  and has been for 4 years.  Has a case production designer, theatre/television/film with him at today's visit, Reena Shipper.  Denies any issues or side effects with the medication.  Has been taking this daily as prescribed.  Also would like to know if he needs to be taking his Eliquis 2 times daily.  Was hospitalized 09/09/2023 for sepsis and pancreatitis.  He did have a portal vein thrombosis and was placed on Eliquis for anticoagulation.  Had a referral for hematology but did not go to the appointment.  Has been taking this medication twice daily since May.  Wants to know if this is something that he needs to be on long-term.  Has not had any issues taking the medication.   Past Medical History:  Diagnosis Date   Mental disorder     Past Surgical History:  Procedure Laterality Date   rt foot surgery  2002   rt kidney surgery      History reviewed. No pertinent family history.  Social History   Socioeconomic History   Marital status: Married    Spouse name: Not on file   Number of children: Not on file   Years of education: Not on file   Highest education level: Not on file  Occupational History   Not on file  Tobacco Use   Smoking status: Former   Smokeless tobacco: Never  Substance and Sexual Activity   Alcohol use: No   Drug use: Yes    Types: Marijuana    Comment: in high school   Sexual activity: Never  Other Topics Concern   Not on file  Social History Narrative   Not on file   Social Drivers of Health   Tobacco Use: Medium Risk (05/11/2024)    Patient History    Smoking Tobacco Use: Former    Smokeless Tobacco Use: Never    Passive Exposure: Not on file  Financial Resource Strain: Low Risk (09/10/2023)   Received from [facility]   Overall Financial Resource Strain (CARDIA)    Difficulty of Paying Living Expenses: Not hard at all  Food Insecurity: No Food Insecurity (09/10/2023)   Received from [facility]   Epic    Within the past 12 months, you worried that your food would run out before you got the money to buy more.: Never true    Within the past 12 months, the food you bought just didn't last and you didn't have money to get more.: Never true  Transportation Needs: No Transportation Needs (09/10/2023)   Received from [facility]   PRAPARE - Transportation    Lack of Transportation (Medical): No    Lack of Transportation (Non-Medical): No  Physical Activity: Inactive (09/10/2023)   Received from [facility]   Exercise Vital Sign    On average, how many days per week do you engage in moderate to strenuous exercise (like a brisk walk)?: 0 days    On average, how many minutes do you engage in exercise  at this level?: 0 min  Stress: No Stress Concern Present (09/10/2023)   Received from [facility] - Occupational Stress Questionnaire    Feeling of Stress : Only a little  Social Connections: Moderately Isolated (09/10/2023)   Received from [facility]   Social Connection and Isolation Panel    In a typical week, how many times do you talk on the phone with family, friends, or neighbors?: More than three times a week    How often do you get together with friends or relatives?: More than three times a week    How often do you attend church or religious services?: 1 to 4 times per year    Do you belong to any clubs or organizations such as church groups, unions, fraternal or athletic groups, or school groups?: No    How often do you attend meetings of the clubs or  organizations you belong to?: Never    Are you married, widowed, divorced, separated, never married, or living with a partner?: Never married  Intimate Partner Violence: Not At Risk (09/10/2023)   Received from [facility]   Epic    Within the last year, have you been afraid of your partner or ex-partner?: No    Within the last year, have you been humiliated or emotionally abused in other ways by your partner or ex-partner?: No    Within the last year, have you been kicked, hit, slapped, or otherwise physically hurt by your partner or ex-partner?: No    Within the last year, have you been raped or forced to have any kind of sexual activity by your partner or ex-partner?: No  Depression (PHQ2-9): Low Risk (05/11/2024)   Depression (PHQ2-9)    PHQ-2 Score: 1  Alcohol Screen: Not on file  Housing: Not on file  Utilities: Low Risk (09/10/2023)   Received from [facility]   Utilities    Within the past 12 months, have you been unable to get utilities(heat, electricity) when it was really needed?: No  Health Literacy: Medium Risk (09/10/2023)   Received from [facility] Literacy    How often do you need to have someone help you when you read instructions, pamphlets, or other written material from your doctor or pharmacy?: Sometimes   Review of Systems  Constitutional:  Negative for fatigue and fever.  Respiratory:  Negative for cough, chest tightness, shortness of breath and wheezing.   Cardiovascular:  Negative for chest pain, palpitations and leg swelling.  Hematological:  Does not bruise/bleed easily.  Psychiatric/Behavioral:  Negative for agitation, behavioral problems, dysphoric mood, hallucinations, self-injury, sleep disturbance and suicidal ideas. The patient is not nervous/anxious.    Objective    BP 95/61   Pulse 65   Temp 97.9 F (36.6 C)   Ht 5' 9 (1.753 m)   Wt 200 lb (90.7 kg)   SpO2 98%   BMI 29.53 kg/m   Physical Exam Vitals and nursing note  reviewed.  Constitutional:      General: He is not in acute distress.    Appearance: Normal appearance. He is not ill-appearing.  Cardiovascular:     Rate and Rhythm: Normal rate and regular rhythm.     Heart sounds: Normal heart sounds, S1 normal and S2 normal. No murmur heard. Pulmonary:     Effort: Pulmonary effort is normal. No respiratory distress.     Breath sounds: Normal breath sounds. No wheezing.  Musculoskeletal:     Right lower leg: No edema.     Left lower leg: No edema.  Neurological:     Mental Status: He is alert.  Psychiatric:        Mood and Affect: Mood normal.        Behavior: Behavior normal.        Thought Content: Thought content normal.        Judgment: Judgment normal.    Assessment & Plan:  1. Paranoid schizophrenia (HCC) (Primary) -Discussed with patient that I would be willing to refill his Risperdal  at this time due to being stable on this medication for 4 years.  Advised that if he begins to have problems with his mental health that I would like for him to see psychiatry at that time.  -Educated patient on appropriate use and potential side effects.   - risperiDONE  (RISPERDAL ) 2 MG tablet; Take 1 tablet (2 mg total) by mouth at bedtime.  Dispense: 90 tablet; Refill: 1  2. High risk medication use -Would like to assess labs due to risk of metabolic issues secondary to the use of second generation antipsychotics.  - Comprehensive metabolic panel - Lipid panel - CBC with Differential - Hemoglobin A1c  3. Current use of long term anticoagulation -Reviewed follow-up visit note from 04/12/2024 appointment.  Physician recommended at least 3 months of anticoagulation per hospital recommendations.  Patient has completed at least 6 months of Eliquis.  Reviewed patient's medical record and patient history which reveals no clear, evidence-based indication for ongoing anticoagulation.  Due to patient's age and increased risk for major bleeding, would like patient  to stop this medication at this time.  Consulted local hematologist and supervising physician, and they agree with the management of this plan.   4. Screen for colon cancer - Cologuard   Return in about 6 months (around 11/08/2024).   Damien KATHEE Pringle, FNP  "

## 2024-05-11 NOTE — Patient Instructions (Signed)
 Glycolic acid, vitamin C

## 2024-05-11 NOTE — Addendum Note (Signed)
 Addended by: WHEELER, Letha Mirabal B on: 05/11/2024 04:12 PM   Modules accepted: Orders

## 2024-05-12 ENCOUNTER — Ambulatory Visit: Payer: Self-pay

## 2024-05-12 LAB — COMPREHENSIVE METABOLIC PANEL WITH GFR
ALT: 10 IU/L (ref 0–44)
AST: 13 IU/L (ref 0–40)
Albumin: 4.1 g/dL (ref 3.9–4.9)
Alkaline Phosphatase: 85 IU/L (ref 47–123)
BUN/Creatinine Ratio: 12 (ref 10–24)
BUN: 14 mg/dL (ref 8–27)
Bilirubin Total: 0.5 mg/dL (ref 0.0–1.2)
CO2: 21 mmol/L (ref 20–29)
Calcium: 9.6 mg/dL (ref 8.6–10.2)
Chloride: 105 mmol/L (ref 96–106)
Creatinine, Ser: 1.14 mg/dL (ref 0.76–1.27)
Globulin, Total: 3.2 g/dL (ref 1.5–4.5)
Glucose: 105 mg/dL — ABNORMAL HIGH (ref 70–99)
Potassium: 3.7 mmol/L (ref 3.5–5.2)
Sodium: 141 mmol/L (ref 134–144)
Total Protein: 7.3 g/dL (ref 6.0–8.5)
eGFR: 71 mL/min/1.73

## 2024-05-12 LAB — CBC WITH DIFFERENTIAL/PLATELET
Basophils Absolute: 0 x10E3/uL (ref 0.0–0.2)
Basos: 1 %
EOS (ABSOLUTE): 0.1 x10E3/uL (ref 0.0–0.4)
Eos: 1 %
Hematocrit: 41.5 % (ref 37.5–51.0)
Hemoglobin: 13.8 g/dL (ref 13.0–17.7)
Immature Grans (Abs): 0 x10E3/uL (ref 0.0–0.1)
Immature Granulocytes: 0 %
Lymphocytes Absolute: 2.1 x10E3/uL (ref 0.7–3.1)
Lymphs: 32 %
MCH: 29.1 pg (ref 26.6–33.0)
MCHC: 33.3 g/dL (ref 31.5–35.7)
MCV: 87 fL (ref 79–97)
Monocytes Absolute: 0.5 x10E3/uL (ref 0.1–0.9)
Monocytes: 8 %
Neutrophils Absolute: 3.7 x10E3/uL (ref 1.4–7.0)
Neutrophils: 58 %
Platelets: 272 x10E3/uL (ref 150–450)
RBC: 4.75 x10E6/uL (ref 4.14–5.80)
RDW: 13 % (ref 11.6–15.4)
WBC: 6.5 x10E3/uL (ref 3.4–10.8)

## 2024-05-12 LAB — HEMOGLOBIN A1C
Est. average glucose Bld gHb Est-mCnc: 103 mg/dL
Hgb A1c MFr Bld: 5.2 % (ref 4.8–5.6)

## 2024-05-12 LAB — LIPID PANEL
Chol/HDL Ratio: 3.3 ratio (ref 0.0–5.0)
Cholesterol, Total: 173 mg/dL (ref 100–199)
HDL: 53 mg/dL
LDL Chol Calc (NIH): 105 mg/dL — ABNORMAL HIGH (ref 0–99)
Triglycerides: 83 mg/dL (ref 0–149)
VLDL Cholesterol Cal: 15 mg/dL (ref 5–40)

## 2024-05-12 NOTE — Progress Notes (Signed)
 Called pt and let case worker know about results - verbalized understanding and stated no questions

## 2024-05-12 NOTE — Progress Notes (Signed)
 Please call the patient and discuss his labs. May call brother with results. Your bad cholesterol was slightly increased. This does not mean that you need medication right now, but it's a good opportunity to make changes that can protect your heart and blood vessels over time. Try to limit foods high in saturated fat which include: fried foods, fast food, fatty meats, and full-fat dairy products. Aim for at least 150 minutes of moderate exercise per week, such as brisk walking. We will recheck cholesterol levels in the future. Glucose was mildly elevated, but no signs of prediabetes or diabetes at this time. All other labs were normal. I did send his Risperdal  prescription in. Advised his case manager that he may discontinue the Eliquis at this time. Let me know if you have any questions. Regards,  Damien Pringle, FNP

## 2024-05-26 LAB — COLOGUARD: COLOGUARD: POSITIVE — AB

## 2024-05-27 NOTE — Progress Notes (Signed)
 Called case manager and she understood cologuard results and will be waiting on call from GI

## 2024-05-27 NOTE — Progress Notes (Signed)
 Please call the patient's case worker and let her know about cologuard results. His cologuard was positive, which warrants a colonoscopy. Cologuard can have some false positives, but I would like him to get a colonoscopy to make sure. I will send a referral into GI. You should hear from them in two weeks, if not let me know.   Reena Shipper- case manager [phone]  Thanks, Damien Pringle, FNP

## 2024-11-08 ENCOUNTER — Ambulatory Visit
